# Patient Record
Sex: Female | Born: 1990 | Hispanic: No | Marital: Single | Smoking: Never smoker
Health system: Southern US, Community
[De-identification: ages and names within clinical notes are randomized; demographics above are authoritative.]

## PROBLEM LIST (undated history)

## (undated) ENCOUNTER — Inpatient Hospital Stay (HOSPITAL_COMMUNITY): Payer: Self-pay

## (undated) DIAGNOSIS — O139 Gestational [pregnancy-induced] hypertension without significant proteinuria, unspecified trimester: Secondary | ICD-10-CM

## (undated) DIAGNOSIS — E039 Hypothyroidism, unspecified: Secondary | ICD-10-CM

## (undated) DIAGNOSIS — O1490 Unspecified pre-eclampsia, unspecified trimester: Secondary | ICD-10-CM

## (undated) HISTORY — PX: OTHER SURGICAL HISTORY: SHX169

## (undated) HISTORY — DX: Unspecified pre-eclampsia, unspecified trimester: O14.90

## (undated) HISTORY — DX: Hypothyroidism, unspecified: E03.9

---
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
## 2012-03-04 ENCOUNTER — Encounter (HOSPITAL_COMMUNITY): Payer: Self-pay | Admitting: *Deleted

## 2012-03-04 ENCOUNTER — Inpatient Hospital Stay (HOSPITAL_COMMUNITY)
Admission: AD | Admit: 2012-03-04 | Discharge: 2012-03-05 | Disposition: A | Discharge status: Home / Self Care | Payer: Medicaid Other | Source: Ambulatory Visit | Attending: Obstetrics & Gynecology | Admitting: Obstetrics & Gynecology

## 2012-03-04 ENCOUNTER — Inpatient Hospital Stay (HOSPITAL_COMMUNITY): Payer: Medicaid Other

## 2012-03-04 DIAGNOSIS — O99891 Other specified diseases and conditions complicating pregnancy: Secondary | ICD-10-CM | POA: Insufficient documentation

## 2012-03-04 DIAGNOSIS — O0930 Supervision of pregnancy with insufficient antenatal care, unspecified trimester: Secondary | ICD-10-CM | POA: Insufficient documentation

## 2012-03-04 DIAGNOSIS — N949 Unspecified condition associated with female genital organs and menstrual cycle: Secondary | ICD-10-CM

## 2012-03-04 DIAGNOSIS — R109 Unspecified abdominal pain: Secondary | ICD-10-CM | POA: Insufficient documentation

## 2012-03-04 DIAGNOSIS — O26859 Spotting complicating pregnancy, unspecified trimester: Secondary | ICD-10-CM

## 2012-03-04 LAB — URINALYSIS, ROUTINE W REFLEX MICROSCOPIC
Glucose, UA: NEGATIVE mg/dL
Nitrite: NEGATIVE
Protein, ur: NEGATIVE mg/dL
Urobilinogen, UA: 0.2 mg/dL (ref 0.0–1.0)

## 2012-03-04 LAB — URINE MICROSCOPIC-ADD ON

## 2012-03-04 NOTE — MAU Note (Signed)
Patient is here with c/o bilateral intermittent sharp pain in her sides.(not quite flank area). She have no prenatal care;Marland Kitchen She states that she confirmed pregnancy at health dept. lmp 09/06/11. She denies any vaginal bleeding or discharge today but have intermittent light bleeding. She reports good fetal movement.

## 2012-03-04 NOTE — MAU Note (Signed)
Pt states, " I am having pain in my right side of my abdomen and my lower back. It hurts when I am laying down and get up or sit and getting up."

## 2012-03-04 NOTE — MAU Provider Note (Signed)
History     CSN: [id number]  Arrival date and time: 03/04/12 1955   None     Chief Complaint  Patient presents with  . Abdominal Pain   HPI  21 year old female G1P0 presents with abdominal pain.  Of note, she has light bleeding around the 9th of every month, the longest of which was in December.  About 3 weeks ago she noticed she was gaining weight, took a home pregnancy test and it was positive.   She has thus far not received any prenatal care.  She does not have a plan currently for who she would like to receive prenatal care from and does not have a primary care doctor.  Her mother reports having called "everywhere in [place]" but no one will see her as she does not have health insurance.  They report having started the [facility] application process.  Her abdominal pain started overnight and woke her from sleep.  She was able to fall back to sleep but it woke her up again several time.  It is localized to the bilateral lower quadrants.  No radiation.  Standing up makes it worse.  Nothing makes it better.  She has not tried any over the counter medications or alternative therapies at this point.  She came in because the pain was abnormal.  It has not worsened or improved over time.  It is constant.  Pain is sharp.    OB History    Grav Para Term Preterm Abortions TAB SAB Ect Mult Living   1               History reviewed. No pertinent past medical history.  Past Surgical History  Procedure Date  . Penny removed from throat     as a child. no scar noted.    History reviewed. No pertinent family history.  History  Substance Use Topics  . Smoking status: Not on file  . Smokeless tobacco: Not on file  . Alcohol Use: Not on file  Never smoker, never used any smokeless tobacco,  Never drinker.  No illicit drugs.  Allergies: No Known Allergies  No prescriptions prior to admission    Review of Systems  Constitutional: Negative for fever, chills and diaphoresis.    Eyes: Negative for blurred vision and double vision.  Respiratory: Negative for shortness of breath.   Cardiovascular: Negative for chest pain.  Gastrointestinal: Positive for abdominal pain (see HPI). Negative for heartburn, nausea, vomiting, diarrhea and constipation.  Genitourinary: Negative for dysuria, frequency and hematuria.  Skin: Negative for itching and rash.  Neurological: Positive for headaches (Has intermittent headaches, none current.). Negative for seizures and loss of consciousness.  Psychiatric/Behavioral: Negative for depression. The patient is not nervous/anxious.    Physical Exam   Blood pressure 131/72, pulse 85, temperature 99 F (37.2 C), temperature source Oral, resp. rate 16, height 5' 4.5" (1.638 m), weight 77.111 kg (170 lb), last menstrual period 09/06/2011.  Physical Exam  Constitutional: She is oriented to person, place, and time. She appears well-developed and well-nourished. No distress.  HENT:  Head: Normocephalic and atraumatic.  Mouth/Throat: No oropharyngeal exudate.  Eyes: Conjunctivae are normal. Right eye exhibits no discharge. Left eye exhibits no discharge. No scleral icterus.  Neck: No tracheal deviation present.  Cardiovascular: Normal rate, regular rhythm and normal heart sounds.   Respiratory: Effort normal and breath sounds normal. No stridor.  GI: Soft. She exhibits no distension. There is no tenderness. There is no rebound and no  guarding.       Fundus palpated 25 cm above pubic symphysis  Neurological: She is alert and oriented to person, place, and time.  Skin: Skin is warm and dry. She is not diaphoretic.  Psychiatric: She has a normal mood and affect. Her behavior is normal. Judgment and thought content normal.    MAU Course  Procedures  Fetal: Baseline rate 145, 15x15 accels, mild variable decels Toco: No contractions Ultrasound: No placental abnormalities, [redacted]w[redacted]d gestation Type and screen: O+, antibody positive but levels and  type pending at time of discharge.  Assessment and Plan  22 year old G1P0 about 25 weeks by palpation, realized she was pregnant ~3 weeks ago.  She has had intermittent vaginal bleeding but has none currently.  No placental abnormalities on ultrasound.  Patient was offered cervical exam and speculum exam, but declined as she wanted to leave.  Pain probably secondary to round ligament pain.  No signs or symptoms concerning for intraabdominal catastrophy.  Will rx flexeril for symptomatic relief.  Patient counseled extensively about accessing the prenatal care system, including our low risk clinic.  She was also told about her ultrasound result, her new EDC, and her antibodies.   Clancy Gourd 03/04/2012, 10:59 PM

## 2012-03-05 ENCOUNTER — Encounter (HOSPITAL_COMMUNITY): Payer: Self-pay | Admitting: Family Medicine

## 2012-03-05 DIAGNOSIS — N949 Unspecified condition associated with female genital organs and menstrual cycle: Secondary | ICD-10-CM

## 2012-03-05 LAB — TYPE AND SCREEN: DAT, IgG: NEGATIVE

## 2012-03-05 MED ORDER — CYCLOBENZAPRINE HCL 5 MG PO TABS: 5.0000 mg | ORAL_TABLET | Freq: Three times a day (TID) | ORAL | Status: AC | PRN

## 2012-03-05 NOTE — MAU Provider Note (Signed)
I was present for the exam and agree with above.  Lateasha Breuer, CNM 03/05/2012 2:26 AM  

## 2012-03-07 LAB — URINE CULTURE
Culture  Setup Time: 201306081325
Special Requests: NORMAL

## 2012-03-15 ENCOUNTER — Other Ambulatory Visit: Payer: Self-pay | Admitting: Advanced Practice Midwife

## 2012-03-15 DIAGNOSIS — O2340 Unspecified infection of urinary tract in pregnancy, unspecified trimester: Secondary | ICD-10-CM

## 2012-03-15 NOTE — Progress Notes (Signed)
Rx Macrobid for UTI. 

## 2012-03-16 MED ORDER — NITROFURANTOIN MONOHYD MACRO 100 MG PO CAPS: 100.0000 mg | ORAL_CAPSULE | Freq: Two times a day (BID) | ORAL | Status: AC

## 2012-04-13 ENCOUNTER — Encounter (HOSPITAL_COMMUNITY): Payer: Self-pay | Admitting: *Deleted

## 2012-04-13 ENCOUNTER — Inpatient Hospital Stay (HOSPITAL_COMMUNITY)
Admission: AD | Admit: 2012-04-13 | Discharge: 2012-04-13 | Disposition: A | Discharge status: Home / Self Care | Payer: Medicaid Other | Source: Ambulatory Visit | Attending: Obstetrics & Gynecology | Admitting: Obstetrics & Gynecology

## 2012-04-13 DIAGNOSIS — B373 Candidiasis of vulva and vagina: Secondary | ICD-10-CM

## 2012-04-13 DIAGNOSIS — O2340 Unspecified infection of urinary tract in pregnancy, unspecified trimester: Secondary | ICD-10-CM

## 2012-04-13 DIAGNOSIS — R109 Unspecified abdominal pain: Secondary | ICD-10-CM | POA: Insufficient documentation

## 2012-04-13 DIAGNOSIS — O99891 Other specified diseases and conditions complicating pregnancy: Secondary | ICD-10-CM | POA: Insufficient documentation

## 2012-04-13 LAB — WET PREP, GENITAL: Trich, Wet Prep: NONE SEEN

## 2012-04-13 LAB — URINALYSIS, ROUTINE W REFLEX MICROSCOPIC
Bilirubin Urine: NEGATIVE
Glucose, UA: NEGATIVE mg/dL
Ketones, ur: NEGATIVE mg/dL
Specific Gravity, Urine: 1.025 (ref 1.005–1.030)
pH: 6 (ref 5.0–8.0)

## 2012-04-13 LAB — URINE MICROSCOPIC-ADD ON

## 2012-04-13 MED ORDER — MICONAZOLE NITRATE 2 % EX CREA: TOPICAL_CREAM | CUTANEOUS | Status: DC

## 2012-04-13 MED ORDER — PRENATAL PLUS 27-1 MG PO TABS: 1.0000 | ORAL_TABLET | Freq: Every day | ORAL | Status: DC

## 2012-04-13 MED ORDER — NITROFURANTOIN MONOHYD MACRO 100 MG PO CAPS: 100.0000 mg | ORAL_CAPSULE | Freq: Two times a day (BID) | ORAL | Status: AC

## 2012-04-13 NOTE — MAU Note (Signed)
Patient states she started having sharp lower abdominal pain today. Has not had prenatal care. Denies any bleeding or leaking and reports good fetal movement.

## 2012-04-13 NOTE — MAU Provider Note (Signed)
  History   CSN: [id number]  Arrival date and time: 04/13/12 1826  Chief Complaint  Patient presents with  . Abdominal Pain   HPI Patient is an 21 yo G1P0 at 33.2 EGA who presents with complaint of abdominal cramping.  She states this started at noon today and stopped around 3 pm. States the cramping is over her lower abdomen only.  It would last about 5 minutes at a time and there would be 10 minute gaps between cramping episodes.  She denies any discharge, bleeding, burning with urination, or other pain.  States she is urinating normally and having normal bowel movements.  She states she has not had any contractions or loss of fluid.  She says that her baby is moving normally.  OB History    Grav Para Term Preterm Abortions TAB SAB Ect Mult Living   1              Has not had any prenatal care to this point.  History reviewed. No pertinent past medical history.  Past Surgical History  Procedure Date  . Penny removed from throat     as a child. no scar noted.    History reviewed. No pertinent family history.  History  Substance Use Topics  . Smoking status: Never Smoker   . Smokeless tobacco: Not on file  . Alcohol Use: No    Allergies: No Known Allergies  No prescriptions prior to admission    ROS negative except per HPI Physical Exam   Blood pressure 138/83, pulse 97, temperature 98.9 F (37.2 C), temperature source Oral, resp. rate 16, height 5\' 5"  (1.651 m), weight 81.829 kg (180 lb 6.4 oz), last menstrual period 09/06/2011, SpO2 100.00%.  Physical Exam  Constitutional: She is oriented to person, place, and time. She appears well-developed and well-nourished.  HENT:  Head: Normocephalic and atraumatic.  Cardiovascular: Normal rate, regular rhythm and normal heart sounds.   Respiratory: Effort normal and breath sounds normal.  GI: Soft. There is tenderness (suprapubic tenderness). There is no rebound and no guarding.       Gravid abdomen  Genitourinary:   Speculum exam: moderate amount of chunky white discharge Cervix non-tender, closed  Musculoskeletal: She exhibits no edema.  Neurological: She is alert and oriented to person, place, and time.  Psychiatric: She has a normal mood and affect.   FHT: 150, moderate variability, accels present, decels absent No contractions present. MAU Course  Procedures  Assessment and Plan  Patient is a G1P0 at 34.2 EGA who presents for abdominal cramping.  UA pending GC/Chlamydia pending Wet prep pending  Patient discussed with Philipp Deputy, CNM and signed out to her.  Marikay Alar 04/13/2012, 7:48 PM   ADDENDUM: Wet prep shows yeast but no other abnormalities.  UA shows +LE and microscopy reveals abnormal # of WBCs and some bacteria.  Will treat with topical antifungals and Macrobid for presumed UTI.  Urine culture sent.  GC/Chlamydia sent.  Pt in agreement with plan and was feeling well at time of discharge.  Hope to get her into the Low-Risk clinic at [facility] soon for prenatal care, have sent message.  Mora Bellman, MD - PGY3

## 2012-04-14 ENCOUNTER — Encounter: Payer: Self-pay | Admitting: *Deleted

## 2012-04-14 LAB — GC/CHLAMYDIA PROBE AMP, GENITAL: Chlamydia, DNA Probe: NEGATIVE

## 2012-04-16 LAB — URINE CULTURE: Colony Count: >=100000

## 2012-04-27 ENCOUNTER — Ambulatory Visit (INDEPENDENT_AMBULATORY_CARE_PROVIDER_SITE_OTHER): Payer: Commercial Managed Care - PPO | Admitting: Family Medicine

## 2012-04-27 ENCOUNTER — Encounter: Payer: Self-pay | Admitting: Family Medicine

## 2012-04-27 VITALS — BP 122/80 | Temp 97.5°F | Wt 183.8 lb

## 2012-04-27 DIAGNOSIS — O0930 Supervision of pregnancy with insufficient antenatal care, unspecified trimester: Secondary | ICD-10-CM

## 2012-04-27 LAB — POCT URINALYSIS DIP (DEVICE)
Bilirubin Urine: NEGATIVE
Glucose, UA: NEGATIVE mg/dL
Hgb urine dipstick: NEGATIVE
Leukocytes, UA: NEGATIVE
Nitrite: NEGATIVE
Urobilinogen, UA: 0.2 mg/dL (ref 0.0–1.0)
pH: 7 (ref 5.0–8.0)

## 2012-04-27 NOTE — Progress Notes (Signed)
Pulse: 94 1hr gtt  Due at 1030

## 2012-04-27 NOTE — Patient Instructions (Addendum)
Braxton Hicks Contractions Pregnancy is commonly associated with contractions of the uterus throughout the pregnancy. Towards the end of pregnancy (32 to 34 weeks), these contractions [facility] Willa Rough) can develop more often and may become more forceful. This is not true labor because these contractions do not result in opening (dilatation) and thinning of the cervix. They are sometimes difficult to tell apart from true labor because these contractions can be forceful and people have different pain tolerances. You should not feel embarrassed if you go to the hospital with false labor. Sometimes, the only way to tell if you are in true labor is for your caregiver to follow the changes in the cervix. How to tell the difference between true and false labor:  False labor.   The contractions of false labor are usually shorter, irregular and not as hard as those of true labor.   They are often felt in the front of the lower abdomen and in the groin.   They may leave with walking around or changing positions while lying down.   They get weaker and are shorter lasting as time goes on.   These contractions are usually irregular.   They do not usually become progressively stronger, regular and closer together as with true labor.   True labor.   Contractions in true labor last 30 to 70 seconds, become very regular, usually become more intense, and increase in frequency.   They do not go away with walking.   The discomfort is usually felt in the top of the uterus and spreads to the lower abdomen and low back.   True labor can be determined by your caregiver with an exam. This will show that the cervix is dilating and getting thinner.  If there are no prenatal problems or other health problems associated with the pregnancy, it is completely safe to be sent home with false labor and await the onset of true labor. HOME CARE INSTRUCTIONS   Keep up with your usual exercises and instructions.   Take  medications as directed.   Keep your regular prenatal appointment.   Eat and drink lightly if you think you are going into labor.   If BH contractions are making you uncomfortable:   Change your activity position from lying down or resting to walking/walking to resting.   Sit and rest in a tub of warm water.   Drink 2 to 3 glasses of water. Dehydration may cause B-H contractions.   Do slow and deep breathing several times an hour.  SEEK IMMEDIATE MEDICAL CARE IF:   Your contractions continue to become stronger, more regular, and closer together.   You have a gushing, burst or leaking of fluid from the vagina.   An oral temperature above 102 F (38.9 C) develops.   You have passage of blood-tinged mucus.   You develop vaginal bleeding.   You develop continuous belly (abdominal) pain.   You have low back pain that you never had before.   You feel the baby's head pushing down causing pelvic pressure.   The baby is not moving as much as it used to.  Document Released: 09/14/2005 Document Revised: 09/03/2011 Document Reviewed: 03/08/2009 [facility] Patient Information 2012 [place], [place]. Pregnancy - Third Trimester The third trimester of pregnancy (the last 3 months) is a period of the most rapid growth for you and your baby. The baby approaches a length of 20 inches and a weight of 6 to 10 pounds. The baby is adding on fat and getting  ready for life outside your body. While inside, babies have periods of sleeping and waking, suck their thumbs, and hiccups. You can often feel small contractions of the uterus. This is false labor. It is also called Braxton-Hicks contractions. This is like a practice for labor. The usual problems in this stage of pregnancy include more difficulty breathing, swelling of the hands and feet from water retention, and having to urinate more often because of the uterus and baby pressing on your bladder.  PRENATAL EXAMS  Blood work may continue to be done  during prenatal exams. These tests are done to check on your health and the probable health of your baby. Blood work is used to follow your blood levels (hemoglobin). Anemia (low hemoglobin) is common during pregnancy. Iron and vitamins are given to help prevent this. You may also continue to be checked for diabetes. Some of the past blood tests may be done again.   The size of the uterus is measured during each visit. This makes sure your baby is growing properly according to your pregnancy dates.   Your blood pressure is checked every prenatal visit. This is to make sure you are not getting toxemia.   Your urine is checked every prenatal visit for infection, diabetes and protein.   Your weight is checked at each visit. This is done to make sure gains are happening at the suggested rate and that you and your baby are growing normally.   Sometimes, an ultrasound is performed to confirm the position and the proper growth and development of the baby. This is a test done that bounces harmless sound waves off the baby so your caregiver can more accurately determine due dates.   Discuss the type of pain medication and anesthesia you will have during your labor and delivery.   Discuss the possibility and anesthesia if a Cesarean Section might be necessary.   Inform your caregiver if there is any mental or physical violence at home.  Sometimes, a specialized non-stress test, contraction stress test and biophysical profile are done to make sure the baby is not having a problem. Checking the amniotic fluid surrounding the baby is called an amniocentesis. The amniotic fluid is removed by sticking a needle into the belly (abdomen). This is sometimes done near the end of pregnancy if an early delivery is required. In this case, it is done to help make sure the baby's lungs are mature enough for the baby to live outside of the womb. If the lungs are not mature and it is unsafe to deliver the baby, an injection  of cortisone medication is given to the mother 1 to 2 days before the delivery. This helps the baby's lungs mature and makes it safer to deliver the baby. CHANGES OCCURING IN THE THIRD TRIMESTER OF PREGNANCY Your body goes through many changes during pregnancy. They vary from person to person. Talk to your caregiver about changes you notice and are concerned about.  During the last trimester, you have probably had an increase in your appetite. It is normal to have cravings for certain foods. This varies from person to person and pregnancy to pregnancy.   You may begin to get stretch marks on your hips, abdomen, and breasts. These are normal changes in the body during pregnancy. There are no exercises or medications to take which prevent this change.   Constipation may be treated with a stool softener or adding bulk to your diet. Drinking lots of fluids, fiber in vegetables, fruits, and  whole grains are helpful.   Exercising is also helpful. If you have been very active up until your pregnancy, most of these activities can be continued during your pregnancy. If you have been less active, it is helpful to start an exercise program such as walking. Consult your caregiver before starting exercise programs.   Avoid all smoking, alcohol, un-prescribed drugs, herbs and "street drugs" during your pregnancy. These chemicals affect the formation and growth of the baby. Avoid chemicals throughout the pregnancy to ensure the delivery of a healthy infant.   Backache, varicose veins and hemorrhoids may develop or get worse.   You will tire more easily in the third trimester, which is normal.   The baby's movements may be stronger and more often.   You may become short of breath easily.   Your belly button may stick out.   A yellow discharge may leak from your breasts called colostrum.   You may have a bloody mucus discharge. This usually occurs a few days to a week before labor begins.  HOME CARE  INSTRUCTIONS   Keep your caregiver's appointments. Follow your caregiver's instructions regarding medication use, exercise, and diet.   During pregnancy, you are providing food for you and your baby. Continue to eat regular, well-balanced meals. Choose foods such as meat, fish, milk and other low fat dairy products, vegetables, fruits, and whole-grain breads and cereals. Your caregiver will tell you of the ideal weight gain.   A physical sexual relationship may be continued throughout pregnancy if there are no other problems such as early (premature) leaking of amniotic fluid from the membranes, vaginal bleeding, or belly (abdominal) pain.   Exercise regularly if there are no restrictions. Check with your caregiver if you are unsure of the safety of your exercises. Greater weight gain will occur in the last 2 trimesters of pregnancy. Exercising helps:   Control your weight.   Get you in shape for labor and delivery.   You lose weight after you deliver.   Rest a lot with legs elevated, or as needed for leg cramps or low back pain.   Wear a good support or jogging bra for breast tenderness during pregnancy. This may help if worn during sleep. Pads or tissues may be used in the bra if you are leaking colostrum.   Do not use hot tubs, steam rooms, or saunas.   Wear your seat belt when driving. This protects you and your baby if you are in an accident.   Avoid raw meat, cat litter boxes and soil used by cats. These carry germs that can cause birth defects in the baby.   It is easier to loose urine during pregnancy. Tightening up and strengthening the pelvic muscles will help with this problem. You can practice stopping your urination while you are going to the bathroom. These are the same muscles you need to strengthen. It is also the muscles you would use if you were trying to stop from passing gas. You can practice tightening these muscles up 10 times a set and repeating this about 3 times per  day. Once you know what muscles to tighten up, do not perform these exercises during urination. It is more likely to cause an infection by backing up the urine.   Ask for help if you have financial, counseling or nutritional needs during pregnancy. Your caregiver will be able to offer counseling for these needs as well as refer you for other special needs.   Make a list  of emergency phone numbers and have them available.   Plan on getting help from family or friends when you go home from the hospital.   Make a trial run to the hospital.   Take prenatal classes with the father to understand, practice and ask questions about the labor and delivery.   Prepare the baby's room/nursery.   Do not travel out of the city unless it is absolutely necessary and with the advice of your caregiver.   Wear only low or no heal shoes to have better balance and prevent falling.  MEDICATIONS AND DRUG USE IN PREGNANCY  Take prenatal vitamins as directed. The vitamin should contain 1 milligram of folic acid. Keep all vitamins out of reach of children. Only a couple vitamins or tablets containing iron may be fatal to a baby or young child when ingested.   Avoid use of all medications, including herbs, over-the-counter medications, not prescribed or suggested by your caregiver. Only take over-the-counter or prescription medicines for pain, discomfort, or fever as directed by your caregiver. Do not use aspirin, ibuprofen (Motrin, Advil, Nuprin) or naproxen (Aleve) unless OK'd by your caregiver.   Let your caregiver also know about herbs you may be using.   Alcohol is related to a number of birth defects. This includes fetal alcohol syndrome. All alcohol, in any form, should be avoided completely. Smoking will cause low birth rate and premature babies.   Street/illegal drugs are very harmful to the baby. They are absolutely forbidden. A baby born to an addicted mother will be addicted at birth. The baby will go  through the same withdrawal an adult does.  SEEK MEDICAL CARE IF: You have any concerns or worries during your pregnancy. It is better to call with your questions if you feel they cannot wait, rather than worry about them. DECISIONS ABOUT CIRCUMCISION You may or may not know the sex of your baby. If you know your baby is a boy, it may be time to think about circumcision. Circumcision is the removal of the foreskin of the penis. This is the skin that covers the sensitive end of the penis. There is no proven medical need for this. Often this decision is made on what is popular at the time or based upon religious beliefs and social issues. You can discuss these issues with your caregiver or pediatrician. SEEK IMMEDIATE MEDICAL CARE IF:   An unexplained oral temperature above 102 F (38.9 C) develops, or as your caregiver suggests.   You have leaking of fluid from the vagina (birth canal). If leaking membranes are suspected, take your temperature and tell your caregiver of this when you call.   There is vaginal spotting, bleeding or passing clots. Tell your caregiver of the amount and how many pads are used.   You develop a bad smelling vaginal discharge with a change in the color from clear to white.   You develop vomiting that lasts more than 24 hours.   You develop chills or fever.   You develop shortness of breath.   You develop burning on urination.   You loose more than 2 pounds of weight or gain more than 2 pounds of weight or as suggested by your caregiver.   You notice sudden swelling of your face, hands, and feet or legs.   You develop belly (abdominal) pain. Round ligament discomfort is a common non-cancerous (benign) cause of abdominal pain in pregnancy. Your caregiver still must evaluate you.   You develop a severe headache that  does not go away.   You develop visual problems, blurred or double vision.   If you have not felt your baby move for more than 1 hour. If you think  the baby is not moving as much as usual, eat something with sugar in it and lie down on your left side for an hour. The baby should move at least 4 to 5 times per hour. Call right away if your baby moves less than that.   You fall, are in a car accident or any kind of trauma.   There is mental or physical violence at home.  Document Released: 09/08/2001 Document Revised: 09/03/2011 Document Reviewed: 03/13/2009 [facility] Patient Information 2012 [place], [place].

## 2012-04-27 NOTE — Progress Notes (Signed)
   Subjective:    [name] [name] is a G1P0 [redacted]w[redacted]d being seen today for her first obstetrical visit.  Her obstetrical history is significant for late to care. Patient does intend to breast feed. Pregnancy history fully reviewed.  Patient reports no bleeding, no contractions, no cramping and no leaking.  Filed Vitals:   04/27/12 0919  BP: 122/80  Temp: 97.5 F (36.4 C)  Weight: 183 lb 12.8 oz (83.371 kg)    HISTORY: OB History    Grav Para Term Preterm Abortions TAB SAB Ect Mult Living   1              # Outc Date GA Lbr Len/2nd Wgt Sex Del Anes PTL Lv   1 CUR              History reviewed. No pertinent past medical history. Past Surgical History  Procedure Date  . Penny removed from throat     as a child. no scar noted.   Family History  Problem Relation Age of Onset  . Hypertension Mother      Exam    Uterus:     Pelvic Exam:    Perineum: No Hemorrhoids, Normal Perineum   Vulva: normal   Vagina:  normal mucosa  System:     Skin: normal coloration and turgor, no rashes    Neurologic: oriented, normal, gait normal; reflexes normal and symmetric   Extremities: normal strength, tone, and muscle mass   HEENT PERRLA and extra ocular movement intact       Neck supple and no masses   Cardiovascular: regular rate and rhythm, no murmurs or gallops   Respiratory:  appears well, vitals normal, no respiratory distress, acyanotic, normal RR, ear and throat exam is normal, neck free of mass or lymphadenopathy, chest clear, no wheezing, crepitations, rhonchi, normal symmetric air entry   Abdomen: soft, non-tender; bowel sounds normal; no masses,  no organomegaly   Urinary: urethral meatus normal      Assessment:    Pregnancy: G1P0 There is no problem list on file for this patient.       Plan:     Initial labs drawn. Prenatal vitamins. Problem list reviewed and updated. Genetic Screening discussed: late to care.  Ultrasound discussed; fetal survey: results  reviewed.  Follow up in 1 weeks. 50% of 45 min visit spent on counseling and coordination of care.    [name] [name] JEHIEL 04/27/2012

## 2012-04-28 ENCOUNTER — Encounter: Payer: Self-pay | Admitting: Family Medicine

## 2012-04-28 DIAGNOSIS — R768 Other specified abnormal immunological findings in serum: Secondary | ICD-10-CM | POA: Insufficient documentation

## 2012-04-28 LAB — OBSTETRIC PANEL
Antibody Screen: NEGATIVE
Basophils Relative: 0 % (ref 0–1)
Eosinophils Absolute: 0 10*3/uL (ref 0.0–0.7)
Eosinophils Relative: 1 % (ref 0–5)
Hemoglobin: 11.6 g/dL — ABNORMAL LOW (ref 12.0–15.0)
Lymphs Abs: 1.5 10*3/uL (ref 0.7–4.0)
MCH: 28.6 pg (ref 26.0–34.0)
MCHC: 33 g/dL (ref 30.0–36.0)
MCV: 86.7 fL (ref 78.0–100.0)
Monocytes Absolute: 0.3 10*3/uL (ref 0.1–1.0)
Monocytes Relative: 4 % (ref 3–12)
Neutrophils Relative %: 77 % (ref 43–77)
RBC: 4.05 MIL/uL (ref 3.87–5.11)
Rh Type: POSITIVE

## 2012-04-28 LAB — T.PALLIDUM AB, TOTAL: T pallidum Antibodies (TP-PA): 0.09 S/CO (ref <0.90)

## 2012-04-28 LAB — GLUCOSE TOLERANCE, 1 HOUR (50G) W/O FASTING: Glucose, 1 Hour GTT: 98 mg/dL (ref 70–140)

## 2012-04-29 LAB — HEMOGLOBINOPATHY EVALUATION
Hemoglobin Other: 0 %
Hgb A: 96.1 % — ABNORMAL LOW (ref 96.8–97.8)
Hgb S Quant: 0 %

## 2012-04-30 LAB — CULTURE, OB URINE: Colony Count: >=100000

## 2012-05-01 LAB — CULTURE, BETA STREP (GROUP B ONLY)

## 2012-05-02 ENCOUNTER — Other Ambulatory Visit: Payer: Self-pay | Admitting: Family Medicine

## 2012-05-02 MED ORDER — AMOXICILLIN 500 MG PO CAPS: 500.0000 mg | ORAL_CAPSULE | Freq: Three times a day (TID) | ORAL | Status: AC

## 2012-05-03 ENCOUNTER — Telehealth: Payer: Self-pay | Admitting: *Deleted

## 2012-05-03 DIAGNOSIS — B951 Streptococcus, group B, as the cause of diseases classified elsewhere: Secondary | ICD-10-CM

## 2012-05-03 NOTE — Telephone Encounter (Signed)
Called pt and informed of +UTI requiring Tx. Pt advised of dosage instructions and Rx has been sent to her pharmacy. Pt voiced understanding.

## 2012-05-03 NOTE — Telephone Encounter (Signed)
Message copied by Jill Side on Tue May 03, 2012  9:39 AM ------      Message from: Levie Heritage      Created: Mon May 02, 2012  6:07 PM       Please call patient, had + urine culture.  Will treat with amoxicillin 500mg  TID x 7 days.  Prescription sent to pharmacy.

## 2012-05-04 ENCOUNTER — Ambulatory Visit (INDEPENDENT_AMBULATORY_CARE_PROVIDER_SITE_OTHER): Payer: Commercial Managed Care - PPO | Admitting: Advanced Practice Midwife

## 2012-05-04 VITALS — BP 124/87 | Temp 97.4°F | Wt 188.1 lb

## 2012-05-04 DIAGNOSIS — O0930 Supervision of pregnancy with insufficient antenatal care, unspecified trimester: Secondary | ICD-10-CM

## 2012-05-04 DIAGNOSIS — O2390 Unspecified genitourinary tract infection in pregnancy, unspecified trimester: Secondary | ICD-10-CM

## 2012-05-04 DIAGNOSIS — N390 Urinary tract infection, site not specified: Secondary | ICD-10-CM

## 2012-05-04 DIAGNOSIS — O2340 Unspecified infection of urinary tract in pregnancy, unspecified trimester: Secondary | ICD-10-CM

## 2012-05-04 DIAGNOSIS — B951 Streptococcus, group B, as the cause of diseases classified elsewhere: Secondary | ICD-10-CM

## 2012-05-04 LAB — POCT URINALYSIS DIP (DEVICE)
Glucose, UA: NEGATIVE mg/dL
Nitrite: NEGATIVE
Protein, ur: NEGATIVE mg/dL
Specific Gravity, Urine: 1.02 (ref 1.005–1.030)
Urobilinogen, UA: 0.2 mg/dL (ref 0.0–1.0)

## 2012-05-04 NOTE — Patient Instructions (Signed)
Normal Labor and Delivery Your caregiver must first be sure you are in labor. Signs of labor include:  You may pass what is called "the mucus plug" before labor begins. This is a small amount of blood stained mucus.   Regular uterine contractions.   The time between contractions get closer together.   The discomfort and pain gradually gets more intense.   Pains are mostly located in the back.   Pains get worse when walking.   The cervix (the opening of the uterus becomes thinner (begins to efface) and opens up (dilates).  Once you are in labor and admitted into the hospital or care center, your caregiver will do the following:  A complete physical examination.   Check your vital signs (blood pressure, pulse, temperature and the fetal heart rate).   Do a vaginal examination (using a sterile glove and lubricant) to determine:   The position (presentation) of the baby (head [vertex] or buttock first).   The level (station) of the baby's head in the birth canal.   The effacement and dilatation of the cervix.   You may have your pubic hair shaved and be given an enema depending on your caregiver and the circumstance.   An electronic monitor is usually placed on your abdomen. The monitor follows the length and intensity of the contractions, as well as the baby's heart rate.   Usually, your caregiver will insert an IV in your arm with a bottle of sugar water. This is done as a precaution so that medications can be given to you quickly during labor or delivery.  NORMAL LABOR AND DELIVERY IS DIVIDED UP INTO 3 STAGES: First Stage This is when regular contractions begin and the cervix begins to efface and dilate. This stage can last from 3 to 15 hours. The end of the first stage is when the cervix is 100% effaced and 10 centimeters dilated. Pain medications may be given by   Injection (morphine, demerol, etc.)   Regional anesthesia (spinal, caudal or epidural, anesthetics given in  different locations of the spine). Paracervical pain medication may be given, which is an injection of and anesthetic on each side of the cervix.  A pregnant woman may request to have "Natural Childbirth" which is not to have any medications or anesthesia during her labor and delivery. Second Stage This is when the baby comes down through the birth canal (vagina) and is born. This can take 1 to 4 hours. As the baby's head comes down through the birth canal, you may feel like you are going to have a bowel movement. You will get the urge to bear down and push until the baby is delivered. As the baby's head is being delivered, the caregiver will decide if an episiotomy (a cut in the perineum and vagina area) is needed to prevent tearing of the tissue in this area. The episiotomy is sewn up after the delivery of the baby and placenta. Sometimes a mask with nitrous oxide is given for the mother to breath during the delivery of the baby to help if there is too much pain. The end of Stage 2 is when the baby is fully delivered. Then when the umbilical cord stops pulsating it is clamped and cut. Third Stage The third stage begins after the baby is completely delivered and ends after the placenta (afterbirth) is delivered. This usually takes 5 to 30 minutes. After the placenta is delivered, a medication is given either by intravenous or injection to help contract   the uterus and prevent bleeding. The third stage is not painful and pain medication is usually not necessary. If an episiotomy was done, it is repaired at this time. After the delivery, the mother is watched and monitored closely for 1 to 2 hours to make sure there is no postpartum bleeding (hemorrhage). If there is a lot of bleeding, medication is given to contract the uterus and stop the bleeding. Document Released: 06/23/2008 Document Revised: 09/03/2011 Document Reviewed: 06/23/2008 Exit[employer] Patient Information 2012 [employer]. 

## 2012-05-04 NOTE — Progress Notes (Signed)
Doing well. More contractions this week. Reviewed labor precautions and where to go. Discussed +GBS and treatment

## 2012-05-04 NOTE — Progress Notes (Signed)
Pulse 99 Has pelvic pressure, some swelling in her hands and feet.

## 2012-05-11 ENCOUNTER — Ambulatory Visit (INDEPENDENT_AMBULATORY_CARE_PROVIDER_SITE_OTHER): Payer: Commercial Managed Care - PPO | Admitting: Family

## 2012-05-11 VITALS — BP 129/86 | Temp 97.1°F | Wt 189.9 lb

## 2012-05-11 DIAGNOSIS — O0930 Supervision of pregnancy with insufficient antenatal care, unspecified trimester: Secondary | ICD-10-CM

## 2012-05-11 DIAGNOSIS — O2340 Unspecified infection of urinary tract in pregnancy, unspecified trimester: Secondary | ICD-10-CM

## 2012-05-11 DIAGNOSIS — B951 Streptococcus, group B, as the cause of diseases classified elsewhere: Secondary | ICD-10-CM

## 2012-05-11 DIAGNOSIS — N390 Urinary tract infection, site not specified: Secondary | ICD-10-CM

## 2012-05-11 DIAGNOSIS — O2390 Unspecified genitourinary tract infection in pregnancy, unspecified trimester: Secondary | ICD-10-CM

## 2012-05-11 LAB — CBC
Hemoglobin: 11.6 g/dL — ABNORMAL LOW (ref 12.0–15.0)
MCH: 29.4 pg (ref 26.0–34.0)
MCV: 84.3 fL (ref 78.0–100.0)
Platelets: 150 10*3/uL (ref 150–400)
RBC: 3.95 MIL/uL (ref 3.87–5.11)
RDW: 14.9 % (ref 11.5–15.5)

## 2012-05-11 LAB — COMPREHENSIVE METABOLIC PANEL
AST: 13 U/L (ref 0–37)
Alkaline Phosphatase: 96 U/L (ref 39–117)
BUN: 8 mg/dL (ref 6–23)
Glucose, Bld: 98 mg/dL (ref 70–99)
Sodium: 134 mEq/L — ABNORMAL LOW (ref 135–145)
Total Bilirubin: 0.6 mg/dL (ref 0.3–1.2)

## 2012-05-11 LAB — POCT URINALYSIS DIP (DEVICE)
Glucose, UA: NEGATIVE mg/dL
Hgb urine dipstick: NEGATIVE
Nitrite: NEGATIVE
Urobilinogen, UA: 1 mg/dL (ref 0.0–1.0)
pH: 6 (ref 5.0–8.0)

## 2012-05-11 NOTE — Progress Notes (Signed)
No PIH symptoms; blood pressure baseline; obtain PIH labs and given precautions.

## 2012-05-11 NOTE — Progress Notes (Signed)
P=98 , C/o runny nose of a clear mucous, sneezing. C/o edema feet only.c/o pelvic pressure,.

## 2012-05-13 ENCOUNTER — Encounter: Payer: Self-pay | Admitting: Obstetrics & Gynecology

## 2012-05-14 LAB — CREATININE CLEARANCE, URINE, 24 HOUR
Creatinine, Urine: 154.4 mg/dL
Creatinine: 0.51 mg/dL (ref 0.50–1.10)

## 2012-05-14 LAB — PROTEIN, URINE, 24 HOUR
Protein, 24H Urine: 95 mg/d (ref 50–100)
Protein, Urine: 10 mg/dL

## 2012-05-18 ENCOUNTER — Encounter: Payer: Self-pay | Admitting: Obstetrics and Gynecology

## 2012-05-18 ENCOUNTER — Ambulatory Visit (INDEPENDENT_AMBULATORY_CARE_PROVIDER_SITE_OTHER): Payer: Medicaid Other | Admitting: Obstetrics and Gynecology

## 2012-05-18 ENCOUNTER — Encounter: Payer: Self-pay | Admitting: Obstetrics & Gynecology

## 2012-05-18 VITALS — BP 134/88 | Temp 98.2°F | Wt 190.5 lb

## 2012-05-18 DIAGNOSIS — IMO0001 Reserved for inherently not codable concepts without codable children: Secondary | ICD-10-CM

## 2012-05-18 DIAGNOSIS — O0930 Supervision of pregnancy with insufficient antenatal care, unspecified trimester: Secondary | ICD-10-CM

## 2012-05-18 DIAGNOSIS — O139 Gestational [pregnancy-induced] hypertension without significant proteinuria, unspecified trimester: Secondary | ICD-10-CM

## 2012-05-18 LAB — POCT URINALYSIS DIP (DEVICE)
Bilirubin Urine: NEGATIVE
Hgb urine dipstick: NEGATIVE
Nitrite: NEGATIVE
Specific Gravity, Urine: 1.025 (ref 1.005–1.030)
Urobilinogen, UA: 0.2 mg/dL (ref 0.0–1.0)
pH: 6.5 (ref 5.0–8.0)

## 2012-05-18 NOTE — Addendum Note (Signed)
Addended by: Jill Side on: 05/18/2012 10:01 AM   Modules accepted: Orders

## 2012-05-18 NOTE — Progress Notes (Signed)
Has false + RPR. No H/A. Good FM. Feels like ? low fluid. EFW 6 1/2. BP recheck:  132/88.  On 8/17 24 hr pro 95, creat .51, LFTs, plts normal. Check AFI, BPP due to borderline BPs, risk preE. AFI 8.6 BPP 8/8. Return Fri for BP check only.

## 2012-05-18 NOTE — Progress Notes (Signed)
P= 87 LBP

## 2012-05-20 ENCOUNTER — Ambulatory Visit (INDEPENDENT_AMBULATORY_CARE_PROVIDER_SITE_OTHER): Payer: Medicaid Other | Admitting: Medical

## 2012-05-20 ENCOUNTER — Encounter: Payer: Self-pay | Admitting: Obstetrics & Gynecology

## 2012-05-20 VITALS — BP 125/91 | HR 85 | Resp 12

## 2012-05-20 DIAGNOSIS — O0930 Supervision of pregnancy with insufficient antenatal care, unspecified trimester: Secondary | ICD-10-CM

## 2012-05-20 NOTE — Progress Notes (Signed)
Patient ID: [name] [name], female   DOB: [birth year], 21 y.o.   MRN: [id number]  Discussed pt BP today with Diane Day, RN. Diane discussed precautions with the patient. Instructed her that she can take tylenol for HA. Patient voiced understanding and did not have any further questions.   Judith Blonder CMA

## 2012-05-25 ENCOUNTER — Inpatient Hospital Stay (HOSPITAL_COMMUNITY): Payer: Medicaid Other | Admitting: Anesthesiology

## 2012-05-25 ENCOUNTER — Inpatient Hospital Stay (HOSPITAL_COMMUNITY)
Admission: AD | Admit: 2012-05-25 | Discharge: 2012-05-27 | DRG: 774 | Disposition: A | Discharge status: Home / Self Care | Payer: Medicaid Other | Source: Ambulatory Visit | Attending: Obstetrics & Gynecology | Admitting: Obstetrics & Gynecology

## 2012-05-25 ENCOUNTER — Encounter (HOSPITAL_COMMUNITY): Payer: Self-pay | Admitting: Anesthesiology

## 2012-05-25 ENCOUNTER — Encounter (HOSPITAL_COMMUNITY): Payer: Self-pay | Admitting: *Deleted

## 2012-05-25 ENCOUNTER — Ambulatory Visit (INDEPENDENT_AMBULATORY_CARE_PROVIDER_SITE_OTHER): Payer: Medicaid Other | Admitting: Obstetrics and Gynecology

## 2012-05-25 ENCOUNTER — Encounter: Payer: Self-pay | Admitting: Obstetrics and Gynecology

## 2012-05-25 VITALS — BP 153/96 | Temp 99.1°F | Wt 191.7 lb

## 2012-05-25 DIAGNOSIS — IMO0002 Reserved for concepts with insufficient information to code with codable children: Secondary | ICD-10-CM | POA: Diagnosis present

## 2012-05-25 DIAGNOSIS — Z3490 Encounter for supervision of normal pregnancy, unspecified, unspecified trimester: Secondary | ICD-10-CM

## 2012-05-25 DIAGNOSIS — O139 Gestational [pregnancy-induced] hypertension without significant proteinuria, unspecified trimester: Secondary | ICD-10-CM

## 2012-05-25 DIAGNOSIS — O169 Unspecified maternal hypertension, unspecified trimester: Secondary | ICD-10-CM

## 2012-05-25 DIAGNOSIS — O0930 Supervision of pregnancy with insufficient antenatal care, unspecified trimester: Secondary | ICD-10-CM

## 2012-05-25 DIAGNOSIS — O99892 Other specified diseases and conditions complicating childbirth: Secondary | ICD-10-CM | POA: Diagnosis present

## 2012-05-25 DIAGNOSIS — Z22330 Carrier of Group B streptococcus: Secondary | ICD-10-CM

## 2012-05-25 DIAGNOSIS — O480 Post-term pregnancy: Secondary | ICD-10-CM

## 2012-05-25 LAB — POCT URINALYSIS DIP (DEVICE)
Ketones, ur: NEGATIVE mg/dL
Leukocytes, UA: NEGATIVE
Protein, ur: NEGATIVE mg/dL
pH: 7 (ref 5.0–8.0)

## 2012-05-25 LAB — CBC
HCT: 36.8 % (ref 36.0–46.0)
HCT: 38.9 % (ref 36.0–46.0)
Hemoglobin: 13 g/dL (ref 12.0–15.0)
MCH: 29 pg (ref 26.0–34.0)
MCH: 29.2 pg (ref 26.0–34.0)
MCHC: 33.2 g/dL (ref 30.0–36.0)
MCV: 87.4 fL (ref 78.0–100.0)
RBC: 4.45 MIL/uL (ref 3.87–5.11)
RDW: 14.9 % (ref 11.5–15.5)

## 2012-05-25 LAB — COMPREHENSIVE METABOLIC PANEL
AST: 17 U/L (ref 0–37)
Albumin: 3.5 g/dL (ref 3.5–5.2)
Alkaline Phosphatase: 123 U/L — ABNORMAL HIGH (ref 39–117)
Glucose, Bld: 73 mg/dL (ref 70–99)
Sodium: 135 mEq/L (ref 135–145)
Total Bilirubin: 0.6 mg/dL (ref 0.3–1.2)

## 2012-05-25 MED ORDER — OXYCODONE-ACETAMINOPHEN 5-325 MG PO TABS: 1.0000 | ORAL_TABLET | ORAL | Status: DC | PRN

## 2012-05-25 MED ORDER — LACTATED RINGERS IV SOLN
500.0000 mL | Freq: Once | INTRAVENOUS | Status: AC
  Administered 2012-05-25: 500 mL via INTRAVENOUS

## 2012-05-25 MED ORDER — ZOLPIDEM TARTRATE 5 MG PO TABS: 5.0000 mg | ORAL_TABLET | Freq: Every evening | ORAL | Status: DC | PRN

## 2012-05-25 MED ORDER — FLEET ENEMA 7-19 GM/118ML RE ENEM: 1.0000 | ENEMA | RECTAL | Status: DC | PRN

## 2012-05-25 MED ORDER — OXYTOCIN 40 UNITS IN LACTATED RINGERS INFUSION - SIMPLE MED
1.0000 m[IU]/min | INTRAVENOUS | Status: DC
  Administered 2012-05-25: 1 m[IU]/min via INTRAVENOUS
  Filled 2012-05-25: qty 1000

## 2012-05-25 MED ORDER — PHENYLEPHRINE 40 MCG/ML (10ML) SYRINGE FOR IV PUSH (FOR BLOOD PRESSURE SUPPORT): 80.0000 ug | PREFILLED_SYRINGE | INTRAVENOUS | Status: DC | PRN

## 2012-05-25 MED ORDER — MISOPROSTOL 25 MCG QUARTER TABLET
25.0000 ug | ORAL_TABLET | ORAL | Status: DC | PRN
  Administered 2012-05-25 (×2): 25 ug via VAGINAL
  Filled 2012-05-25 (×2): qty 0.25

## 2012-05-25 MED ORDER — LACTATED RINGERS IV SOLN
INTRAVENOUS | Status: DC
  Administered 2012-05-25: 100 mL/h via INTRAVENOUS
  Administered 2012-05-25 – 2012-05-26 (×3): via INTRAVENOUS

## 2012-05-25 MED ORDER — ACETAMINOPHEN 500 MG PO TABS
1000.0000 mg | ORAL_TABLET | Freq: Once | ORAL | Status: AC
  Administered 2012-05-25: 1000 mg via ORAL
  Filled 2012-05-25: qty 2

## 2012-05-25 MED ORDER — EPHEDRINE 5 MG/ML INJ
10.0000 mg | INTRAVENOUS | Status: DC | PRN
  Filled 2012-05-25: qty 4

## 2012-05-25 MED ORDER — PHENYLEPHRINE 40 MCG/ML (10ML) SYRINGE FOR IV PUSH (FOR BLOOD PRESSURE SUPPORT)
80.0000 ug | PREFILLED_SYRINGE | INTRAVENOUS | Status: DC | PRN
  Filled 2012-05-25: qty 5

## 2012-05-25 MED ORDER — MAGNESIUM SULFATE BOLUS VIA INFUSION
4.0000 g | Freq: Once | INTRAVENOUS | Status: DC
  Filled 2012-05-25 (×2): qty 500

## 2012-05-25 MED ORDER — OXYTOCIN BOLUS FROM INFUSION
250.0000 mL | Freq: Once | INTRAVENOUS | Status: DC
  Filled 2012-05-25: qty 500

## 2012-05-25 MED ORDER — IBUPROFEN 600 MG PO TABS: 600.0000 mg | ORAL_TABLET | Freq: Four times a day (QID) | ORAL | Status: DC | PRN

## 2012-05-25 MED ORDER — OXYTOCIN 40 UNITS IN LACTATED RINGERS INFUSION - SIMPLE MED: 62.5000 mL/h | Freq: Once | INTRAVENOUS | Status: DC

## 2012-05-25 MED ORDER — LIDOCAINE HCL (PF) 1 % IJ SOLN
INTRAMUSCULAR | Status: DC | PRN
  Administered 2012-05-25 (×2): 4 mL

## 2012-05-25 MED ORDER — LACTATED RINGERS IV SOLN: 500.0000 mL | INTRAVENOUS | Status: DC | PRN

## 2012-05-25 MED ORDER — EPHEDRINE 5 MG/ML INJ: 10.0000 mg | INTRAVENOUS | Status: DC | PRN

## 2012-05-25 MED ORDER — TERBUTALINE SULFATE 1 MG/ML IJ SOLN: 0.2500 mg | Freq: Once | INTRAMUSCULAR | Status: AC | PRN

## 2012-05-25 MED ORDER — CITRIC ACID-SODIUM CITRATE 334-500 MG/5ML PO SOLN: 30.0000 mL | ORAL | Status: DC | PRN

## 2012-05-25 MED ORDER — LIDOCAINE HCL (PF) 1 % IJ SOLN
30.0000 mL | INTRAMUSCULAR | Status: DC | PRN
  Filled 2012-05-25: qty 30

## 2012-05-25 MED ORDER — FENTANYL 2.5 MCG/ML BUPIVACAINE 1/10 % EPIDURAL INFUSION (WH - ANES)
14.0000 mL/h | INTRAMUSCULAR | Status: DC
  Administered 2012-05-26 (×4): 14 mL/h via EPIDURAL
  Filled 2012-05-25 (×5): qty 60

## 2012-05-25 MED ORDER — FENTANYL 2.5 MCG/ML BUPIVACAINE 1/10 % EPIDURAL INFUSION (WH - ANES)
INTRAMUSCULAR | Status: DC | PRN
  Administered 2012-05-25: 14 mL/h via EPIDURAL

## 2012-05-25 MED ORDER — PENICILLIN G POTASSIUM 5000000 UNITS IJ SOLR
5.0000 10*6.[IU] | Freq: Once | INTRAVENOUS | Status: AC
  Administered 2012-05-25: 5 10*6.[IU] via INTRAVENOUS
  Filled 2012-05-25: qty 5

## 2012-05-25 MED ORDER — DIPHENHYDRAMINE HCL 50 MG/ML IJ SOLN: 12.5000 mg | INTRAMUSCULAR | Status: DC | PRN

## 2012-05-25 MED ORDER — MAGNESIUM SULFATE 40 G IN LACTATED RINGERS - SIMPLE
2.0000 g/h | INTRAVENOUS | Status: DC
  Administered 2012-05-25 – 2012-05-26 (×2): 2 g/h via INTRAVENOUS
  Filled 2012-05-25 (×2): qty 500

## 2012-05-25 MED ORDER — ONDANSETRON HCL 4 MG/2ML IJ SOLN
4.0000 mg | Freq: Four times a day (QID) | INTRAMUSCULAR | Status: DC | PRN
  Administered 2012-05-25: 4 mg via INTRAVENOUS
  Filled 2012-05-25: qty 2

## 2012-05-25 MED ORDER — NALBUPHINE SYRINGE 5 MG/0.5 ML
10.0000 mg | INJECTION | INTRAMUSCULAR | Status: DC | PRN
  Administered 2012-05-25 (×2): 10 mg via INTRAVENOUS
  Filled 2012-05-25 (×2): qty 1

## 2012-05-25 MED ORDER — PENICILLIN G POTASSIUM 5000000 UNITS IJ SOLR
2.5000 10*6.[IU] | INTRAVENOUS | Status: DC
  Administered 2012-05-25 – 2012-05-26 (×6): 2.5 10*6.[IU] via INTRAVENOUS
  Filled 2012-05-25 (×10): qty 2.5

## 2012-05-25 NOTE — Progress Notes (Signed)
[name] [name] is a 21 y.o. G1P0000 at @ [redacted]w[redacted]d by 2nd trimester [place] with late entry to prenatal care presenting for IOL for pre-eclampsia and postdates.   Subjective:   Objective: BP 134/81  Pulse 95  Temp 98.2 F (36.8 C) (Oral)  Resp 18  Ht 5\' 3"  (1.6 m)  Wt 86.637 kg (191 lb)  BMI 33.83 kg/m2  LMP 09/06/2011   Total I/O In: 497.5 [P.O.:120; I.V.:377.5] Out: 295 [Urine:295]  Pulm: LCTAB CV: RRR Abd: nontender Neuro: 3+ reflexes in biceps, patellar with 1 beat of clonus  FHT:  FHR: 140s bpm, variability: moderate,  accelerations:  Present,  decelerations:  Absent UC:   irregular, every 2-8 minutes SVE:   Dilation: 1 Effacement (%): 40 Station: -2 Exam by:: Montez Morita, RNC  Labs: Lab Results  Component Value Date   WBC 9.8 05/25/2012   HGB 12.2 05/25/2012   HCT 36.8 05/25/2012   MCV 87.4 05/25/2012   PLT 165 05/25/2012    Assessment / Plan: 1) IOL  - bishops 4  - cytotec PV 25mg  placed at 1300  - routine admission orders  - epidural prn   2) Pre-eclampsia  - in the setting of elevated BPs and worsening HA not responsive to tylenol at home although now mostly resolved s/p tylenol 1000mg . Normal AST/ALT/PLT.  - spot urine/prot pending  - cont MG 4/2  - q2hour mag checks  - still hyperreflexic but symmetric, no pathologic clonus  3) GBS +  - PCN q4hr- no allergy   4) FWB  - category I tracing  - PCN as above for GBS ppx   5) Anticipate SVD   [name] [name] 05/25/2012, 2:13 PM

## 2012-05-25 NOTE — Progress Notes (Signed)
Constant frontal headaches. Good FM. Membranes swept -> slight blood. EFW  6 1/2 -7. BP recheck is worse. D/W Dr. Macon Large re IOL today. Pt agrees.

## 2012-05-25 NOTE — Progress Notes (Signed)
Iasia Forcier is a 21 y.o. G1P0000 at [redacted]w[redacted]d by 2nd trimester [place] with late entry to prenatal care admitted for induction of labor due to pre-eclampsia and post dates.  Subjective: No complaints. Feeling contractions some but tolerable with nubane.  No LOF, VB. +FM  Headache improved with tylenol.  No vision changes, SOB, RUQ pain.    Objective: BP 118/77  Pulse 75  Temp 98.1 F (36.7 C) (Oral)  Resp 18  Ht 5\' 3"  (1.6 m)  Wt 86.637 kg (191 lb)  BMI 33.83 kg/m2  LMP 09/06/2011 I/O last 3 completed shifts: In: 997.5 [P.O.:120; I.V.:877.5] Out: 495 [Urine:495]    FHT:  FHR: 120s bpm, variability: moderate,  accelerations:  Present,  decelerations:  Absent UC:   irregular, every 2-5 minutes SVE:   Dilation: 2 Effacement (%): 60 Station: -2 Exam by:: [employer]  Labs: Lab Results  Component Value Date   WBC 9.8 05/25/2012   HGB 12.2 05/25/2012   HCT 36.8 05/25/2012   MCV 87.4 05/25/2012   PLT 165 05/25/2012    Assessment / Plan:  1) IOL  - ripening s/p cytotec x1 - will place 2nd dose of cytotec - epidural in active labor  2) Pre-eclampsia  - in the setting of elevated BPs and worsening HA not responsive to tylenol at home although now mostly resolved s/p tylenol 1000mg . Normal AST/ALT/PLT. Prot/cr ration 0.08 - cont MG 4/2  - q2hour mag checks  - still hyperreflexic but symmetric, no pathologic clonus   3) GBS +  - PCN q4hr- no allergy   4) FWB  - category I tracing  - PCN as above for GBS ppx   5) Anticipate SVD   Reola Calkins, Theora Vankirk 05/25/2012, 7:11 PM

## 2012-05-25 NOTE — Patient Instructions (Signed)
Labor Induction  Most women go into labor on their own between 37 and 42 weeks of the pregnancy. When this does not happen or when there is a medical need, medicine or other methods may be used to induce labor. Labor induction causes a pregnant woman's uterus to contract. It also causes the cervix to soften (ripen), open (dilate), and thin out (efface). Usually, labor is not induced before 39 weeks of the pregnancy unless there is a problem with the baby or mother. Whether your labor will be induced depends on a number of factors, including the following:  The medical condition of you and the baby.   How many weeks along you are.   The status of baby's lung maturity.   The condition of the cervix.   The position of the baby.  REASONS FOR LABOR INDUCTION  The health of the baby or mother is at risk.   The pregnancy is overdue by 1 week or more.   The water breaks but labor does not start on its own.   The mother has a health condition or serious illness such as high blood pressure, infection, placental abruption, or diabetes.   The amniotic fluid amounts are low around the baby.   The baby is distressed.  REASONS TO NOT INDUCE LABOR Labor induction may not be a good idea if:  It is shown that your baby does not tolerate labor.   An induction is just more convenient.   You want the baby to be born on a certain date, like a holiday.   You have had previous surgeries on your uterus, such as a myomectomy or the removal of fibroids.   Your placenta lies very low in the uterus and blocks the opening of the cervix (placenta previa).   Your baby is not in a head down position.   The umbilical cord drops down into the birth canal in front of the baby. This could cut off the baby's blood and oxygen supply.   You have had a previous cesarean delivery.   There areunusual circumstances, such as the baby being extremely premature.  RISKS AND COMPLICATIONS Problems may occur in the  process of induction and plans may need to be modified as a situation unfolds. Some of the risks of induction include:  Change in fetal heart rate, such as too high, too low, or erratic.   Risk of fetal distress.   Risk of infection to mother and baby.   Increased chance of having a cesarean delivery.   The rare, but increased chance that the placenta will separate from the uterus (abruption).   Uterine rupture (very rare).  When induction is needed for medical reasons, the benefits of induction may outweigh the risks. BEFORE THE PROCEDURE Your caregiver will check your cervix and the baby's position. This will help your caregiver decide if you are far enough along for an induction to work. PROCEDURE Several methods of labor induction may be used, such as:   Taking prostaglandin medicine to dilate and ripen the cervix. The medicine will also start contractions. It can be taken by mouth or by inserting a suppository into the vagina.   A thin tube (catheter) with a balloon on the end may be inserted into your vagina to dilate the cervix. Once inserted, the balloon expands with water, which causes the cervix to open.   Striping the membranes. Your caregiver inserts a finger between the cervix and membranes, which causes the cervix to be stretched and   may cause the uterus to contract. This is often done during an office visit. You will be sent home to wait for the contractions to begin. You will then come in for an induction.   Breaking the water. Your caregiver will make a hole in the amniotic sac using a small instrument. Once the amniotic sac breaks, contractions should begin. This may still take hours to see an effect.   Taking medicine to trigger or strengthen contractions. This medicine is given intravenously through a tube in your arm.  All of the methods of induction, besides stripping the membranes, will be done in the hospital. Induction is done in the hospital so that you and the  baby can be carefully monitored. AFTER THE PROCEDURE Some inductions can take up to 2 or 3 days. Depending on the cervix, it usually takes less time. It takes longer when you are induced early in the pregnancy or if this is your first pregnancy. If a mother is still pregnant and the induction has been going on for 2 to 3 days, either the mother will be sent home or a cesarean delivery will be needed. Document Released: 02/03/2007 Document Revised: 09/03/2011 Document Reviewed: 07/20/2011 Exit[employer] Patient Information 2012 [employer]. 

## 2012-05-25 NOTE — Progress Notes (Signed)
P = 91 Edema in face and hands Pain in low pelvic area

## 2012-05-25 NOTE — Progress Notes (Signed)
Pt desires IOL @ 41 wks- IOL scheduled 05/30/12 @ 0700.

## 2012-05-25 NOTE — H&P (Addendum)
[name] [name] is a 21 y.o. female G1 @ [redacted]w[redacted]d by 2nd trimester [place] with late entry to prenatal care presenting for IOL for PIH and postdates.  Started having elevated blood pressures starting at around 37 weeks.  Has been also having constant occiput headaches for the last 2 weeks.  Seem to be worsening. Not improved by tylenol.  No vision changes, RUQ pain, weight change.  No VB,LOF, CTX.  +FM.    No CP, SOB, nausea, vomiting, dysuria.  Some orthostasis.    History OB History    Grav Para Term Preterm Abortions TAB SAB Ect Mult Living   1 0 0 0 0 0 0 0 0 0      History reviewed. No pertinent past medical history. Past Surgical History  Procedure Date  . Penny removed from throat     as a child. no scar noted.   Family History: family history includes Hypertension in her mother. Social History:  reports that she has never smoked. She has never used smokeless tobacco. She reports that she does not drink alcohol or use illicit drugs.   Prenatal Transfer Tool  Maternal Diabetes: No Genetic Screening: Normal Maternal Ultrasounds/Referrals: Normal- @[redacted]w[redacted]d /1205gm (45%)/ posterior placenta, AFI WNL, normal anatomy, female Fetal Ultrasounds or other Referrals:  None Maternal Substance Abuse:  No Significant Maternal Medications:  PNV, Amoxicillin Significant Maternal Lab Results: Had +RPR at 1:2 dilution in July, but NEGative FTA-Abs Other Comments:  None  ROS See above in HPI for pertinent positives and negatives.     Blood pressure 160/68, pulse 80, temperature 98.4 F (36.9 C), temperature source Oral, resp. rate 18, height 5\' 3"  (1.6 m), weight 86.637 kg (191 lb), last menstrual period 09/06/2011. Maternal Exam:  Uterine Assessment: Contraction strength is mild.  Contraction frequency is irregular.   Abdomen: Patient reports no abdominal tenderness. Estimated fetal weight is 7#.   Fetal presentation: vertex     Fetal Exam Fetal Monitor Review: Mode: ultrasound.   Baseline  rate: 130s.  Variability: moderate (6-25 bpm).   Pattern: accelerations present and no decelerations.    Fetal State Assessment: Category I - tracings are normal.     Physical Exam  Nursing note and vitals reviewed. Constitutional: She appears well-developed and well-nourished.  HENT:  Head: Normocephalic and atraumatic.  Mouth/Throat: Oropharynx is clear and moist.  Eyes: Conjunctivae and EOM are normal.  Neck: Neck supple.  Cardiovascular: Normal rate, regular rhythm and normal heart sounds.   Respiratory: Effort normal and breath sounds normal.  GI: Soft. Bowel sounds are normal.  Musculoskeletal: Normal range of motion.  Neurological: She has normal reflexes.  Skin: Skin is warm and dry.  Psychiatric: She has a normal mood and affect. Her behavior is normal.    Prenatal labs: ABO, Rh: O/POS/-- (07/31 1112) Antibody: NEG (07/31 1112) Rubella: 359.0 (07/31 1112) RPR: REACTIVE (07/31 1112) but AB negative HBsAg: NEGATIVE (07/31 1112)  HIV: NON REACTIVE (07/31 1112)  GBS: Positive (07/31 0000)  1 hour gtt:  98  Assessment/Plan:  21 yo G1 @ [redacted]w[redacted]d by 2nd trimester [place] with late entry to prenatal care presenting for IOL for pre-eclampsia and postdates.  1) IOL - admit to L&D - bishops 4 - start cytotec PV 25mg  q4 - routine admission orders - epidural prn  2) Pre-eclampsia - in the setting of elevated BPs and worsening HA not responsive to tylenol - start MG 4/2 - q2hour mag checks - treat headache with tylenol initially   3) GBS + - start  PCN q4hr- no allergy  4) FWB - category I tracing - PCN as above for GBS ppx  5) Anticipate SVD  Rulon Abide 05/25/2012, 11:22 AM  Reviewed with Sharen Counter.

## 2012-05-25 NOTE — Anesthesia Preprocedure Evaluation (Signed)

## 2012-05-25 NOTE — H&P (Signed)
Attestation of Attending Supervision of Resident: Evaluation and management procedures were performed by the [facility] Medicine Resident under my supervision.  I have reviewed the resident's note and chart, and I agree with the management and plan under the care of the CNM.  Jaynie Collins, MD, FACOG Attending Obstetrician & Gynecologist Faculty Practice, [facility]

## 2012-05-25 NOTE — Progress Notes (Signed)
   [name] [name] is a 21 y.o. G1P0000 at [redacted]w[redacted]d  admitted for induction of labor due to Pre-eclamptic toxemia of pregnancy..  Subjective:  Comfortable with epidural Objective: BP 127/81  Pulse 73  Temp 98.3 F (36.8 C) (Axillary)  Resp 18  Ht 5\' 3"  (1.6 m)  Wt 86.637 kg (191 lb)  BMI 33.83 kg/m2  SpO2 98%  LMP 09/06/2011 Total I/O In: 1247.1 [P.O.:200; I.V.:947.1; IV Piggyback:100] Out: 900 [Urine:900]  FHT:  FHR: 140 bpm, variability: moderate,  accelerations:  Present,  decelerations:  Absent UC:   irregular, every 2-4 minutes SVE:   Dilation: 3 Effacement (%): 70 Station: -2 Exam by:: Marylee Floras, CNM  Labs: Lab Results  Component Value Date   WBC 12.7* 05/25/2012   HGB 13.0 05/25/2012   HCT 38.9 05/25/2012   MCV 87.4 05/25/2012   PLT 158 05/25/2012  MgS04 @ 2gm/hr  Assessment / Plan: IOL for preeclampsia, ripening phase complete  Labor: no Fetal Wellbeing:  Category I Pain Control:  Epidural Anticipated MOD:  NSVD  [name] [name] Brawley 05/25/2012, 11:19 PM

## 2012-05-26 ENCOUNTER — Encounter (HOSPITAL_COMMUNITY): Payer: Self-pay | Admitting: *Deleted

## 2012-05-26 DIAGNOSIS — O9989 Other specified diseases and conditions complicating pregnancy, childbirth and the puerperium: Secondary | ICD-10-CM

## 2012-05-26 DIAGNOSIS — O99892 Other specified diseases and conditions complicating childbirth: Secondary | ICD-10-CM

## 2012-05-26 DIAGNOSIS — O480 Post-term pregnancy: Secondary | ICD-10-CM

## 2012-05-26 DIAGNOSIS — IMO0002 Reserved for concepts with insufficient information to code with codable children: Secondary | ICD-10-CM

## 2012-05-26 LAB — SYPHILIS: RPR W/REFLEX TO RPR TITER AND TREPONEMAL ANTIBODIES, TRADITIONAL SCREENING AND DIAGNOSIS ALGORITHM: RPR Ser Ql: REACTIVE — AB

## 2012-05-26 LAB — CBC
HCT: 33.6 % — ABNORMAL LOW (ref 36.0–46.0)
MCHC: 32.7 g/dL (ref 30.0–36.0)
RDW: 15.1 % (ref 11.5–15.5)

## 2012-05-26 LAB — MRSA PCR SCREENING: MRSA by PCR: NEGATIVE

## 2012-05-26 LAB — T.PALLIDUM AB, IGG: T pallidum Antibodies (TP-PA): 0.09 S/CO (ref <0.90)

## 2012-05-26 MED ORDER — LACTATED RINGERS IV SOLN
INTRAVENOUS | Status: DC
  Administered 2012-05-26: 100 mL/h via INTRAVENOUS
  Administered 2012-05-27: 06:00:00 via INTRAVENOUS

## 2012-05-26 MED ORDER — WITCH HAZEL-GLYCERIN EX PADS: 1.0000 "application " | MEDICATED_PAD | CUTANEOUS | Status: DC | PRN

## 2012-05-26 MED ORDER — SODIUM CHLORIDE 0.9 % IJ SOLN: 3.0000 mL | Freq: Two times a day (BID) | INTRAMUSCULAR | Status: DC

## 2012-05-26 MED ORDER — SODIUM CHLORIDE 0.9 % IJ SOLN: 3.0000 mL | INTRAMUSCULAR | Status: DC | PRN

## 2012-05-26 MED ORDER — PRENATAL MULTIVITAMIN CH
1.0000 | ORAL_TABLET | Freq: Every day | ORAL | Status: DC
  Administered 2012-05-26 – 2012-05-27 (×2): 1 via ORAL
  Filled 2012-05-26 (×2): qty 1

## 2012-05-26 MED ORDER — MEASLES, MUMPS & RUBELLA VAC ~~LOC~~ INJ
0.5000 mL | INJECTION | Freq: Once | SUBCUTANEOUS | Status: DC
  Filled 2012-05-26: qty 0.5

## 2012-05-26 MED ORDER — OXYCODONE-ACETAMINOPHEN 5-325 MG PO TABS: 1.0000 | ORAL_TABLET | ORAL | Status: DC | PRN

## 2012-05-26 MED ORDER — SODIUM CHLORIDE 0.9 % IV SOLN: 250.0000 mL | INTRAVENOUS | Status: DC | PRN

## 2012-05-26 MED ORDER — ONDANSETRON HCL 4 MG PO TABS: 4.0000 mg | ORAL_TABLET | ORAL | Status: DC | PRN

## 2012-05-26 MED ORDER — ZOLPIDEM TARTRATE 5 MG PO TABS: 5.0000 mg | ORAL_TABLET | Freq: Every evening | ORAL | Status: DC | PRN

## 2012-05-26 MED ORDER — OXYTOCIN 40 UNITS IN LACTATED RINGERS INFUSION - SIMPLE MED: 62.5000 mL/h | INTRAVENOUS | Status: DC | PRN

## 2012-05-26 MED ORDER — SENNOSIDES-DOCUSATE SODIUM 8.6-50 MG PO TABS
2.0000 | ORAL_TABLET | Freq: Every day | ORAL | Status: DC
  Administered 2012-05-26: 2 via ORAL

## 2012-05-26 MED ORDER — DIBUCAINE 1 % RE OINT: 1.0000 "application " | TOPICAL_OINTMENT | RECTAL | Status: DC | PRN

## 2012-05-26 MED ORDER — MAGNESIUM SULFATE 40 G IN LACTATED RINGERS - SIMPLE
1.0000 g/h | INTRAVENOUS | Status: AC
  Filled 2012-05-26: qty 500

## 2012-05-26 MED ORDER — LANOLIN HYDROUS EX OINT: TOPICAL_OINTMENT | CUTANEOUS | Status: DC | PRN

## 2012-05-26 MED ORDER — IBUPROFEN 600 MG PO TABS
600.0000 mg | ORAL_TABLET | Freq: Four times a day (QID) | ORAL | Status: DC
  Administered 2012-05-26 – 2012-05-27 (×5): 600 mg via ORAL
  Filled 2012-05-26 (×5): qty 1

## 2012-05-26 MED ORDER — TETANUS-DIPHTH-ACELL PERTUSSIS 5-2.5-18.5 LF-MCG/0.5 IM SUSP
0.5000 mL | Freq: Once | INTRAMUSCULAR | Status: AC
  Administered 2012-05-27: 0.5 mL via INTRAMUSCULAR
  Filled 2012-05-26: qty 0.5

## 2012-05-26 MED ORDER — FLEET ENEMA 7-19 GM/118ML RE ENEM: 1.0000 | ENEMA | Freq: Every day | RECTAL | Status: DC | PRN

## 2012-05-26 MED ORDER — ONDANSETRON HCL 4 MG/2ML IJ SOLN: 4.0000 mg | INTRAMUSCULAR | Status: DC | PRN

## 2012-05-26 MED ORDER — SIMETHICONE 80 MG PO CHEW: 80.0000 mg | CHEWABLE_TABLET | ORAL | Status: DC | PRN

## 2012-05-26 MED ORDER — DIPHENHYDRAMINE HCL 25 MG PO CAPS: 25.0000 mg | ORAL_CAPSULE | Freq: Four times a day (QID) | ORAL | Status: DC | PRN

## 2012-05-26 MED ORDER — BISACODYL 10 MG RE SUPP: 10.0000 mg | Freq: Every day | RECTAL | Status: DC | PRN

## 2012-05-26 MED ORDER — BENZOCAINE-MENTHOL 20-0.5 % EX AERO
1.0000 "application " | INHALATION_SPRAY | CUTANEOUS | Status: DC | PRN
  Filled 2012-05-26: qty 56

## 2012-05-26 NOTE — Progress Notes (Signed)
[name] [name] is a 21 y.o. G1P0000 at [redacted]w[redacted]d  admitted for induction of labor due to postdates and pre-eclampsia.  Subjective: Comfortable with epidural, denies feeling any pressure. Family supportive at bedside.  Objective: BP 116/74  Pulse 84  Temp 98.4 F (36.9 C) (Axillary)  Resp 16  Ht 5\' 3"  (1.6 m)  Wt 86.637 kg (191 lb)  BMI 33.83 kg/m2  SpO2 98%  LMP 09/06/2011 I/O last 3 completed shifts: In: 3673.1 [P.O.:320; I.V.:2953.1; IV Piggyback:400] Out: 2675 [Urine:2675] Total I/O In: 1109 [P.O.:600; I.V.:409; IV Piggyback:100] Out: 600 [Urine:600]  FHT:  FHR: 120 bpm, variability: minimal ,  accelerations:  Present,  decelerations:  Absent UC:   regular, every 2-4 minutes SVE:   Dilation: 10 Effacement (%): 100 Station: +2 Exam by:: K. Boooker CNM  Labs: Lab Results  Component Value Date   WBC 12.7* 05/25/2012   HGB 13.0 05/25/2012   HCT 38.9 05/25/2012   MCV 87.4 05/25/2012   PLT 158 05/25/2012    Assessment / Plan: Induction of labor due to postdates and pre-eclampsia,  progressing well on pitocin  Labor: Progressing normally Preeclampsia:  on magnesium sulfate and no signs or symptoms of toxicity Fetal Wellbeing:  Category II Pain Control:  Epidural I/D:  n/a Anticipated MOD:  NSVD  Will labor down until feels pressure/urge to push  Marge Duncans 05/26/2012, 12:07 PM

## 2012-05-26 NOTE — Progress Notes (Signed)
[name] [name] is a 21 y.o. G1P0000 at 41w3dadmitted for induction of labor due to preeclampsia.  Subjective: No HA/SOB/RUQ pain  Objective: BP 117/76  Pulse 78  Temp 97.7 F (36.5 C) (Oral)  Resp 18  Ht 5\' 3"  (1.6 m)  Wt 86.637 kg (191 lb)  BMI 33.83 kg/m2  SpO2 98%  LMP 09/06/2011 I/O last 3 completed shifts: In: 1222.5 [P.O.:120; I.V.:1002.5; IV Piggyback:100] Out: 495 [Urine:495] Total I/O In: 2197.8 [P.O.:200; I.V.:1697.8; IV Piggyback:300] Out: 1705 [Urine:1705]  FHT:  FHR: 120 bpm, variability: moderate,  accelerations:  Present,  decelerations:  Absent UC:   regular, every 3-4 minutes SVE:   Dilation: 4 Effacement (%): 70 Station: -1 Exam by:: foley,rn  Labs: Lab Results  Component Value Date   WBC 12.7* 05/25/2012   HGB 13.0 05/25/2012   HCT 38.9 05/25/2012   MCV 87.4 05/25/2012   PLT 158 05/25/2012    Assessment / Plan: Induction of labor due to preeclampsia,  progressing well on pitocin  Labor: Progressing on Pitocin, will continue to increase then AROM Preeclampsia:  on magnesium sulfate and no signs or symptoms of toxicity Fetal Wellbeing:  Category I Pain Control:  Epidural I/D:  PCN for GBS Anticipated MOD:  NSVD  [name] [name] 05/26/2012, 5:55 AM

## 2012-05-26 NOTE — Progress Notes (Signed)
I examined and assessed patient and agree with PA-S plan of care. Cheral Marker, CNM, WHNP-BC

## 2012-05-26 NOTE — Anesthesia Procedure Notes (Signed)
Epidural Patient location during procedure: OB Start time: 05/25/2012 9:14 PM  Staffing Anesthesiologist: Zenobia Kuennen A. Performed by: anesthesiologist   Preanesthetic Checklist Completed: patient identified, site marked, surgical consent, pre-op evaluation, timeout performed, IV checked, risks and benefits discussed and monitors and equipment checked  Epidural Patient position: sitting Prep: site prepped and draped and DuraPrep Patient monitoring: continuous pulse ox and blood pressure Approach: midline Injection technique: LOR air  Needle:  Needle type: Tuohy  Needle gauge: 17 G Needle length: 9 cm and 9 Needle insertion depth: 7 and 7 cm Catheter type: closed end flexible Catheter size: 19 Gauge Catheter at skin depth: 12 cm Test dose: negative and Other  Assessment Events: blood not aspirated, injection not painful, no injection resistance, negative IV test and no paresthesia  Additional Notes Patient identified. Risks and benefits discussed including failed block, incomplete  Pain control, post dural puncture headache, nerve damage, paralysis, blood pressure Changes, nausea, vomiting, reactions to medications-both toxic and allergic and post Partum back pain. All questions were answered. Patient expressed understanding and wished to proceed. Sterile technique was used throughout procedure. Epidural site was Dressed with sterile barrier dressing. No paresthesias, signs of intravascular injection Or signs of intrathecal spread were encountered.  Patient was more comfortable after the epidural was dosed. Please see RN's note for documentation of vital signs and FHR which are stable.

## 2012-05-26 NOTE — Progress Notes (Signed)
Patient ID: [name] [name], female   DOB: 1990/10/12, 21 y.o.   MRN: [id number] Called by RN who states pt reports feeling dizzy, unable to open eyes or get out of bed.  Stat magnesium level ordered= 6.2.  VSS.  Adequate urine output.  Reviewed w/ Tanja Port, CNM and Constant, MD., will decrease magnesium to 1gm/hr.    Cheral Marker, CNM, WHNP-BC

## 2012-05-26 NOTE — Progress Notes (Signed)
   Blenda Wisecup is a 21 y.o. G1P0000 at [redacted]w[redacted]d  admitted for induction of labor due to Pre-eclamptic toxemia of pregnancy..  Subjective: Comfortable with epidural Objective: BP 115/70  Pulse 72  Temp 98.3 F (36.8 C) (Axillary)  Resp 18  Ht 5\' 3"  (1.6 m)  Wt 86.637 kg (191 lb)  BMI 33.83 kg/m2  SpO2 98%  LMP 09/06/2011 Total I/O In: 1463.7 [P.O.:200; I.V.:1063.7; IV Piggyback:200] Out: 950 [Urine:950]  FHT:  FHR: 130 bpm, variability: moderate,  accelerations:  Present,  decelerations:  Absent UC:   irregular, every 2-4 minutes SVE:  Deferred  Labs: Lab Results  Component Value Date   WBC 12.7* 05/25/2012   HGB 13.0 05/25/2012   HCT 38.9 05/25/2012   MCV 87.4 05/25/2012   PLT 158 05/25/2012    Assessment / Plan: IOL for preeclampsia, beginning on Pitocin  Labor: IOL for preeclapmsia, beginning pitocin Fetal Wellbeing:  Category I Pain Control:  Epidural Anticipated MOD:  NSVD  CRESENZO-DISHMAN,Conni Knighton 05/26/2012, 12:47 AM

## 2012-05-26 NOTE — Progress Notes (Signed)
  Subjective:   Patient reports continued comfort with epidural.  Reports "popping"  Objective: BP 118/74  Pulse 79  Temp 98 F (36.7 C) (Oral)  Resp 16  Ht 5\' 3"  (1.6 m)  Wt 86.637 kg (191 lb)  BMI 33.83 kg/m2  SpO2 98%  LMP 09/06/2011 I/O last 3 completed shifts: In: 3673.1 [P.O.:320; I.V.:2953.1; IV Piggyback:400] Out: 2675 [Urine:2675] Total I/O In: 259 [I.V.:159; IV Piggyback:100] Out: 400 [Urine:400]  FHT:  FHR: 135 bpm, variability: minimal ,  accelerations:  Abscent,  decelerations:  Present early UC:   regular, every 3-4 minutes SVE:   Dilation: 5.5 Effacement (%): 100 Station: -2;-1 Exam by:: E. [facility] RNC  Clear fluid noted on towel.  Labs: Lab Results  Component Value Date   WBC 12.7* 05/25/2012   HGB 13.0 05/25/2012   HCT 38.9 05/25/2012   MCV 87.4 05/25/2012   PLT 158 05/25/2012    Assessment / Plan: Augmentation of labor, progressing well  Labor: Progressing normally Preeclampsia:  no signs or symptoms of toxicity and labs stable Fetal Wellbeing:  Category II Pain Control:  Epidural I/D:  n/a Anticipated MOD:  NSVD  Marcelline Mates 05/26/2012, 9:54 AM  I was present for assessment and agree with note above. Maryruth Hancock, CNM

## 2012-05-27 DIAGNOSIS — IMO0002 Reserved for concepts with insufficient information to code with codable children: Secondary | ICD-10-CM

## 2012-05-27 DIAGNOSIS — O480 Post-term pregnancy: Principal | ICD-10-CM

## 2012-05-27 LAB — CBC
HCT: 24.8 % — ABNORMAL LOW (ref 36.0–46.0)
Platelets: 131 10*3/uL — ABNORMAL LOW (ref 150–400)
RDW: 15.3 % (ref 11.5–15.5)
WBC: 10.1 10*3/uL (ref 4.0–10.5)

## 2012-05-27 MED ORDER — FERROUS SULFATE 325 (65 FE) MG PO TABS: 325.0000 mg | ORAL_TABLET | Freq: Two times a day (BID) | ORAL | Status: DC

## 2012-05-27 MED ORDER — IBUPROFEN 600 MG PO TABS: 600.0000 mg | ORAL_TABLET | Freq: Four times a day (QID) | ORAL | Status: AC | PRN

## 2012-05-27 MED ORDER — DOCUSATE SODIUM 100 MG PO CAPS: 100.0000 mg | ORAL_CAPSULE | Freq: Every day | ORAL | Status: AC

## 2012-05-27 MED ORDER — FERROUS SULFATE 325 (65 FE) MG PO TABS
325.0000 mg | ORAL_TABLET | Freq: Two times a day (BID) | ORAL | Status: DC
  Administered 2012-05-27 (×2): 325 mg via ORAL
  Filled 2012-05-27 (×2): qty 1

## 2012-05-27 MED ORDER — NORETHINDRONE 0.35 MG PO TABS: 1.0000 | ORAL_TABLET | Freq: Every day | ORAL | Status: DC

## 2012-05-27 NOTE — Progress Notes (Signed)
UR chart review completed.  

## 2012-05-27 NOTE — Progress Notes (Signed)
Post Partum Day 1 Subjective: no complaints, up ad lib, voiding, tolerating PO and + flatus. Initially last night had some blurry vision but since th mag has been turned down some, she is feeling much better.  No HA, worsening vision changes, RUQ pain or other complaints.   Objective: Blood pressure 108/67, pulse 93, temperature 98 F (36.7 C), temperature source Oral, resp. rate 16, height 5\' 3"  (1.6 m), weight 85.049 kg (187 lb 8 oz), last menstrual period 09/06/2011, SpO2 98.00%, unknown if currently breastfeeding.  Physical Exam:  General: alert, cooperative and no distress Lochia: appropriate Uterine Fundus: firm Incision: n/a DVT Evaluation: No evidence of DVT seen on physical exam. No cords or calf tenderness. No significant calf/ankle edema. NEURO: 3+ reflexes- consistent with previous   Basename 05/27/12 0540 05/26/12 1514  HGB 8.2* 11.0*  HCT 24.8* 33.6*    Assessment/Plan: Plan for discharge tomorrow, Breastfeeding and Contraception wants micronor and plans for mirena at 6 week pp visit. Start BID iron for Hgb of 8.2 Mag now at 1mg /hr- good UOP.  Continue till 24hours pp   LOS: 2 days   Rulon Abide 05/27/2012, 7:41 AM

## 2012-05-27 NOTE — Progress Notes (Signed)
Post Partum Day 1 Subjective: no complaints She denies chest pain, SOB, lightheadedness or dizziness. Patient tolerating po intake  Objective: Blood pressure 108/67, pulse 93, temperature 98 F (36.7 C), temperature source Oral, resp. rate 16, height 5\' 3"  (1.6 m), weight 85.049 kg (187 lb 8 oz), last menstrual period 09/06/2011, SpO2 98.00%, unknown if currently breastfeeding.  Physical Exam:  General: alert, cooperative and no distress Lochia: appropriate Uterine Fundus: firm Incision: n/a DVT Evaluation: No evidence of DVT seen on physical exam. No cords or calf tenderness.   Basename 05/27/12 0540 05/26/12 1514  HGB 8.2* 11.0*  HCT 24.8* 33.6*    Assessment/Plan: Patient doing well on magnesium sulfate No si/sx of magnesium toxicity or worsening PIH Encouraged continued breastfeeding Patient planning on using MIrena IUD for birth control Continue current care   LOS: 2 days   Ison Wichmann 05/27/2012, 7:36 AM

## 2012-05-27 NOTE — Progress Notes (Signed)
Seen also by me Headache improved since Magnesium Sulfate drip being decreased Filed Vitals:   05/27/12 0500 05/27/12 0553 05/27/12 0600 05/27/12 0700  BP: 94/51  104/60 108/67  Pulse: 77  74 93  Temp:      TempSrc:      Resp:      Height:  5\' 3"  (1.6 m)    Weight:  187 lb 8 oz (85.049 kg)    SpO2: 97%  98% 98%    Agree with note  Wynelle Bourgeois CNM 05/27/12

## 2012-05-27 NOTE — Anesthesia Postprocedure Evaluation (Signed)
  Anesthesia Post-op Note  Patient: [name] [name]  Procedure(s) Performed: * No procedures listed *  Patient Location: A-ICU  Anesthesia Type: Epidural  Level of Consciousness: awake and alert   Airway and Oxygen Therapy: Patient Spontanous Breathing  Post-op Pain: none  Post-op Assessment: Patient's Cardiovascular Status Stable, Respiratory Function Stable, Patent Airway, No signs of Nausea or vomiting, Adequate PO intake, Pain level controlled, No headache, No backache, No residual numbness and No residual motor weakness  Post-op Vital Signs: Reviewed and stable  Complications: No apparent anesthesia complications

## 2012-05-27 NOTE — Discharge Summary (Signed)
Obstetric Discharge Summary Reason for Admission: induction of labor for postdates and pre-eclampsia Prenatal Procedures: NST Intrapartum Procedures: spontaneous vaginal delivery and GBS prophylaxis, magnesium sulfate infusion Postpartum Procedures: magnesium IV for 24 hours post partum Complications-Operative and Postpartum: 2nd degree perineal laceration Hemoglobin  Date Value Range Status  05/27/2012 8.2* 12.0 - 15.0 g/dL Final     REPEATED TO VERIFY     DELTA CHECK NOTED     HCT  Date Value Range Status  05/27/2012 24.8* 36.0 - 46.0 % Final    21 yo G1 who presented for induction of labor for postdates and elevated pressures in the setting of headache not resolved with medication. She was started on Magnesium for pre-eclampsia.  Spot prot/cr ratio 0.08, normal CBC and CMP.  Initially had some elevated pressures that resolved after starting mag. GBS was adequately ppx with PCN.  She delivered a liveborn female vaginally on 8/29 @ approximately 14:20.  2nd degree laceration repaired. She was continued on mag for 24 hours post delivery with normal pressures throughout and observed for 4 hours after.  She was discharged home with her newborn both in stable condition.  She was started on iron 325mg  BID for a postpartum hgb of 8.2.    Physical Exam:  General: alert, cooperative, appears stated age and no distress Lochia: appropriate Uterine Fundus: firm Neuro: 3+ reflexes - at baseline for her DVT Evaluation: No evidence of DVT seen on physical exam. No cords or calf tenderness. No significant calf/ankle edema.  Discharge Diagnoses: Term Pregnancy-delivered and pre-eclamplsia, 2nd degree perineal laceration  Discharge Information: Date: 05/27/2012 Activity: unrestricted, pelvic rest Diet: routine Medications: PNV, Ibuprofen and Iron and Micronor Condition: stable Instructions: refer to practice specific booklet Discharge to: home  Follow-up Information    Follow up with  [facility] in 1 week. (for a blood pressure check)    Contact information:   [address] [place] [address] [phone]      Follow up with [facility] in 6 weeks. (for your after delivery visit)    Contact information:   [address] [place] [address] [phone]         Scheduled Meds:   . ferrous sulfate  325 mg Oral BID WC  . ibuprofen  600 mg Oral Q6H  . measles, mumps and rubella vaccine  0.5 mL Subcutaneous Once  . prenatal multivitamin  1 tablet Oral Daily  . senna-docusate  2 tablet Oral QHS  . sodium chloride  3 mL Intravenous Q12H  . TDaP  0.5 mL Intramuscular Once   Continuous Infusions:   . lactated ringers Stopped (05/27/12 1600)  . magnesium sulfate 40 grams in LR 500 mL Stopped (05/27/12 1600)  . oxytocin 40 units in LR 1000 mL Stopped (05/26/12 2057)   PRN Meds:.sodium chloride, benzocaine-Menthol, bisacodyl, dibucaine, diphenhydrAMINE, lanolin, ondansetron (ZOFRAN) IV, ondansetron, oxyCODONE-acetaminophen, oxytocin 40 units in LR 1000 mL, simethicone, sodium chloride, sodium phosphate, witch hazel-glycerin, zolpidem \  Newborn Data: Live born female  Birth Weight: 7 lb 2.8 oz (3255 g) APGAR: 8, 9  Home with mother.  [name] [name] 05/27/2012, 7:10 PM

## 2012-05-29 NOTE — Discharge Summary (Signed)
I saw and examined patient prior to discharge and agree with above. Vitals stable, BP normal range, no headache, RUQ pain, vision changes. Discussed signs and symptoms of preeclampsia and reasons to return to hospital.  Napoleon Form, MD

## 2012-05-30 ENCOUNTER — Inpatient Hospital Stay (HOSPITAL_COMMUNITY): Admission: RE | Admit: 2012-05-30 | Payer: Medicaid Other | Source: Ambulatory Visit

## 2012-06-27 ENCOUNTER — Ambulatory Visit (INDEPENDENT_AMBULATORY_CARE_PROVIDER_SITE_OTHER): Payer: Medicaid Other | Admitting: Obstetrics and Gynecology

## 2012-06-27 ENCOUNTER — Encounter: Payer: Self-pay | Admitting: Obstetrics and Gynecology

## 2012-06-27 NOTE — Progress Notes (Signed)
  Subjective:     [name] [name] is a 21 y.o. female G1P1001 who presents for a postpartum visit. She is 6 weeks postpartum following a spontaneous vaginal delivery. I have fully reviewed the prenatal and intrapartum course. Had 2nd degreee laceration. The delivery was at 40.2 gestational weeks. IOL for mild preE. Outcome: spontaneous vaginal delivery. Anesthesia: spinal. Postpartum course has been uncomplicated. Baby's course has been uncomplicated. Baby is feeding by breast. Bleeding no bleeding. Bowel function is normal. Bladder function is normal. Patient is sexually active. Contraception method is oral progesterone-only contraceptive. She is not planning to be sexually active and has not started but does have the Rx. Postpartum depression screening: negative.  The following portions of the patient's history were reviewed and updated as appropriate: allergies, current medications, past family history, past medical history, past social history, past surgical history and problem list.  Review of Systems Pertinent items are noted in HPI.   Objective:    BP 132/66  Pulse 90  Temp 98.1 F (36.7 C) (Oral)  Ht 5\' 3"  (1.6 m)  Wt 165 lb (74.844 kg)  BMI 29.23 kg/m2  General:  alert, appears stated age and no distress   Breasts:  lactational  Lungs: clear to auscultation bilaterally  Heart:  regular rate and rhythm, S1, S2 normal, no murmur, click, rub or gallop  Abdomen: soft, non-tender; bowel sounds normal; no masses,  no organomegaly   Vulva:  normal  Vagina: normal vagina and vicryl suture noted 2 cm in posterior vagina  Cervix:  no lesions  Corpus: normal  Adnexa:  not evaluated  Rectal Exam: Not performed.       Thyroid not enlarged Assessment:    6 wks postpartum exam. Pap smear not done at today's visit.   Plan:    1. Contraception: oral progesterone-only contraceptive 2. Recheck here 3 months after starting OCPs.  3. Follow up in: 6 months or as needed.

## 2012-06-27 NOTE — Patient Instructions (Addendum)

## 2013-01-25 ENCOUNTER — Encounter: Payer: Self-pay | Admitting: Obstetrics & Gynecology

## 2013-01-26 ENCOUNTER — Ambulatory Visit: Payer: Medicaid Other | Admitting: Obstetrics & Gynecology

## 2013-02-17 ENCOUNTER — Ambulatory Visit (INDEPENDENT_AMBULATORY_CARE_PROVIDER_SITE_OTHER): Payer: Medicaid Other | Admitting: Obstetrics & Gynecology

## 2013-02-17 ENCOUNTER — Encounter: Payer: Self-pay | Admitting: Obstetrics & Gynecology

## 2013-02-17 VITALS — BP 126/76 | Temp 98.1°F | Ht 64.0 in | Wt 179.0 lb

## 2013-02-17 DIAGNOSIS — Z30017 Encounter for initial prescription of implantable subdermal contraceptive: Secondary | ICD-10-CM

## 2013-02-17 DIAGNOSIS — Z3202 Encounter for pregnancy test, result negative: Secondary | ICD-10-CM

## 2013-02-17 DIAGNOSIS — Z309 Encounter for contraceptive management, unspecified: Secondary | ICD-10-CM

## 2013-02-17 MED ORDER — ETONOGESTREL 68 MG ~~LOC~~ IMPL
68.0000 mg | DRUG_IMPLANT | Freq: Once | SUBCUTANEOUS | Status: AC
  Administered 2013-02-17: 68 mg via SUBCUTANEOUS

## 2013-02-17 NOTE — Progress Notes (Signed)
Patient ID: [name] [name], female   DOB: 05-06-1991, 22 y.o.   MRN: [id number] Patient given informed consent, she signed consent form. Pregnancy test was negative.  Appropriate time out taken.  Patient's left arm was prepped and draped in the usual sterile fashion. The ruler used to measure and mark insertion area.  Patient was prepped with alcohol swab and then injected with 5 ml of 1 % lidocaine.  She was prepped with betadine, Nexplanon removed from packaging,  Device confirmed in needle, then inserted full length of needle and withdrawn per handbook instructions.  There was minimal blood loss.  Patient insertion site covered with guaze and a pressure bandage to reduce any bruising.  The patient tolerated the procedure well and was given post procedure instructions. Return in about one month for Nexplanon check.

## 2013-02-17 NOTE — Patient Instructions (Addendum)
Contraceptive Implant Information  A contraceptive implant is a plastic rod that is inserted under the skin. It is usually inserted under the skin of your upper arm. It continually releases small amounts of progestin (synthetic progesterone) into the bloodstream. This prevents an egg from being released from the ovary. It also thickens the cervical mucus to prevent sperm from entering the cervix, and it thins the uterine lining to prevent a fertilized egg from attaching to the uterus. They can be effective for up to 3 years. Implants do not provide protection against sexually transmitted diseases (STDs).   The procedure to insert an implant usually takes about 10 minutes. There may be minor bruising, swelling, and discomfort at the insertion site for a couple days. The implant begins to work within the first day. Other contraceptive protection should be used for 2 weeks. Follow up with your caregiver to get rechecked as directed.  Your caregiver will make sure you are a good candidate for the contraceptive implant. Discuss with your caregiver the possible side effects of the implant  ADVANTAGES  · It prevents pregnancy for up to 3 years.  · It is easily reversible.  · It is convenient.  · The progestins may protect against uterine and ovarian cancer.  · It can be used when breastfeeding.  · It can be used by women who cannot take estrogen.  DISADVANTAGES  · You may have irregular or unplanned vaginal bleeding.  · You may develop side effects, including headache, weight gain, acne, breast tenderness, or mood changes.  · You may have tissue or nerve damage after insertion (rare).  · It may be difficult and uncomfortable to remove.  · Certain medications may interfere with the effectiveness of the implants.   REMOVAL OF IMPLANT  The implant should be removed in 3 years or as directed by your caregiver. The implants effect wears off in a few hours after removal. Your ability to get pregnant (fertility) is restored  within a couple of weeks. New implants can be inserted as soon as the old ones are removed if desired.  DO NOT GET THE IMPLANT IF:   · You are pregnant.  · You have a history of breast cancer, osteoporosis, blood clots, heart disease, diabetes, high blood pressure, liver disease, tumors, or stroke.    · You have undiagnosed vaginal bleeding.  · You have overly sensitive to certain parts of the implant.  Document Released: 09/03/2011 Document Revised: 12/07/2011 Document Reviewed: 09/03/2011  ExitCare® Patient Information ©2014 [employer].

## 2013-02-21 ENCOUNTER — Other Ambulatory Visit: Payer: Self-pay | Admitting: Obstetrics & Gynecology

## 2013-03-17 ENCOUNTER — Ambulatory Visit: Payer: Medicaid Other | Admitting: Obstetrics & Gynecology

## 2013-10-09 ENCOUNTER — Ambulatory Visit: Payer: Self-pay | Admitting: Obstetrics & Gynecology

## 2013-11-06 ENCOUNTER — Encounter: Payer: Self-pay | Admitting: Obstetrics & Gynecology

## 2013-11-06 ENCOUNTER — Ambulatory Visit (INDEPENDENT_AMBULATORY_CARE_PROVIDER_SITE_OTHER): Payer: Medicaid Other | Admitting: Obstetrics & Gynecology

## 2013-11-06 VITALS — BP 156/106 | HR 110 | Temp 98.2°F | Ht 66.0 in | Wt 190.0 lb

## 2013-11-06 DIAGNOSIS — Z309 Encounter for contraceptive management, unspecified: Secondary | ICD-10-CM

## 2013-11-06 NOTE — Patient Instructions (Signed)
Contraception Choices Contraception (birth control) is the use of any methods or devices to prevent pregnancy. Below are some methods to help avoid pregnancy. HORMONAL METHODS   Contraceptive implant This is a thin, plastic tube containing progesterone hormone. It does not contain estrogen hormone. Your health care provider inserts the tube in the inner part of the upper arm. The tube can remain in place for up to 3 years. After 3 years, the implant must be removed. The implant prevents the ovaries from releasing an egg (ovulation), thickens the cervical mucus to prevent sperm from entering the uterus, and thins the lining of the inside of the uterus.  Progesterone-only injections These injections are given every 3 months by your health care provider to prevent pregnancy. This synthetic progesterone hormone stops the ovaries from releasing eggs. It also thickens cervical mucus and changes the uterine lining. This makes it harder for sperm to survive in the uterus.  Birth control pills These pills contain estrogen and progesterone hormone. They work by preventing the ovaries from releasing eggs (ovulation). They also cause the cervical mucus to thicken, preventing the sperm from entering the uterus. Birth control pills are prescribed by a health care provider.Birth control pills can also be used to treat heavy periods.  Minipill This type of birth control pill contains only the progesterone hormone. They are taken every day of each month and must be prescribed by your health care provider.  Birth control patch The patch contains hormones similar to those in birth control pills. It must be changed once a week and is prescribed by a health care provider.  Vaginal ring The ring contains hormones similar to those in birth control pills. It is left in the vagina for 3 weeks, removed for 1 week, and then a new one is put back in place. The patient must be comfortable inserting and removing the ring from the  vagina.A health care provider's prescription is necessary.  Emergency contraception Emergency contraceptives prevent pregnancy after unprotected sexual intercourse. This pill can be taken right after sex or up to 5 days after unprotected sex. It is most effective the sooner you take the pills after having sexual intercourse. Most emergency contraceptive pills are available without a prescription. Check with your pharmacist. Do not use emergency contraception as your only form of birth control. BARRIER METHODS   Female condom This is a thin sheath (latex or rubber) that is worn over the penis during sexual intercourse. It can be used with spermicide to increase effectiveness.  Female condom. This is a soft, loose-fitting sheath that is put into the vagina before sexual intercourse.  Diaphragm This is a soft, latex, dome-shaped barrier that must be fitted by a health care provider. It is inserted into the vagina, along with a spermicidal jelly. It is inserted before intercourse. The diaphragm should be left in the vagina for 6 to 8 hours after intercourse.  Cervical cap This is a round, soft, latex or plastic cup that fits over the cervix and must be fitted by a health care provider. The cap can be left in place for up to 48 hours after intercourse.  Sponge This is a soft, circular piece of polyurethane foam. The sponge has spermicide in it. It is inserted into the vagina after wetting it and before sexual intercourse.  Spermicides These are chemicals that kill or block sperm from entering the cervix and uterus. They come in the form of creams, jellies, suppositories, foam, or tablets. They do not require a   prescription. They are inserted into the vagina with an applicator before having sexual intercourse. The process must be repeated every time you have sexual intercourse. INTRAUTERINE CONTRACEPTION  Intrauterine device (IUD) This is a T-shaped device that is put in a woman's uterus during a  menstrual period to prevent pregnancy. There are 2 types:  Copper IUD This type of IUD is wrapped in copper wire and is placed inside the uterus. Copper makes the uterus and fallopian tubes produce a fluid that kills sperm. It can stay in place for 10 years.  Hormone IUD This type of IUD contains the hormone progestin (synthetic progesterone). The hormone thickens the cervical mucus and prevents sperm from entering the uterus, and it also thins the uterine lining to prevent implantation of a fertilized egg. The hormone can weaken or kill the sperm that get into the uterus. It can stay in place for 3 5 years, depending on which type of IUD is used. PERMANENT METHODS OF CONTRACEPTION  Female tubal ligation This is when the woman's fallopian tubes are surgically sealed, tied, or blocked to prevent the egg from traveling to the uterus.  Hysteroscopic sterilization This involves placing a small coil or insert into each fallopian tube. Your doctor uses a technique called hysteroscopy to do the procedure. The device causes scar tissue to form. This results in permanent blockage of the fallopian tubes, so the sperm cannot fertilize the egg. It takes about 3 months after the procedure for the tubes to become blocked. You must use another form of birth control for these 3 months.  Female sterilization This is when the female has the tubes that carry sperm tied off (vasectomy).This blocks sperm from entering the vagina during sexual intercourse. After the procedure, the man can still ejaculate fluid (semen). NATURAL PLANNING METHODS  Natural family planning This is not having sexual intercourse or using a barrier method (condom, diaphragm, cervical cap) on days the woman could become pregnant.  Calendar method This is keeping track of the length of each menstrual cycle and identifying when you are fertile.  Ovulation method This is avoiding sexual intercourse during ovulation.  Symptothermal method This is  avoiding sexual intercourse during ovulation, using a thermometer and ovulation symptoms.  Post ovulation method This is timing sexual intercourse after you have ovulated. Regardless of which type or method of contraception you choose, it is important that you use condoms to protect against the transmission of sexually transmitted infections (STIs). Talk with your health care provider about which form of contraception is most appropriate for you. Document Released: 09/14/2005 Document Revised: 05/17/2013 Document Reviewed: 03/09/2013 Exit[employer] Patient Information 2014 [employer].  

## 2013-11-06 NOTE — Progress Notes (Signed)
Patient ID: [name] ReamerRachel [name], female   DOB: Feb 19, 1991, 23 y.o.   MRN: [id number] Patient given informed consent, she signed consent form. She has gained weight since her Nexplanon was placed.  She wants it removed.  She reports that she has not been sexually active for 8 months. Appropriate time out taken.  Patient's left arm was prepped and draped in the usual sterile fashion. Patient was prepped with alcohol swab and then injected with 3 ml of 1 % lidocaine.  She was prepped with betadine.  A #15 blade was used to make a small incision over the Nexplanon rod.  Using curved forceps, the end of the rod was grasped and the scar tissue was removed and the device was removed intact.  There was minimal blood loss. The incision site was covered with guaze and a pressure bandage to reduce any bruising.  The patient tolerated the procedure well and was given post procedure instructions.

## 2013-11-20 ENCOUNTER — Encounter: Payer: Self-pay | Admitting: *Deleted

## 2014-07-30 ENCOUNTER — Encounter: Payer: Self-pay | Admitting: Obstetrics & Gynecology

## 2015-01-17 ENCOUNTER — Ambulatory Visit: Payer: Self-pay | Admitting: Obstetrics & Gynecology

## 2018-07-07 DIAGNOSIS — L639 Alopecia areata, unspecified: Secondary | ICD-10-CM | POA: Diagnosis not present

## 2018-07-07 DIAGNOSIS — N926 Irregular menstruation, unspecified: Secondary | ICD-10-CM | POA: Diagnosis not present

## 2018-07-07 DIAGNOSIS — I10 Essential (primary) hypertension: Secondary | ICD-10-CM | POA: Diagnosis not present

## 2018-07-07 DIAGNOSIS — F430 Acute stress reaction: Secondary | ICD-10-CM | POA: Diagnosis not present

## 2018-08-11 DIAGNOSIS — Z01411 Encounter for gynecological examination (general) (routine) with abnormal findings: Secondary | ICD-10-CM | POA: Diagnosis not present

## 2018-08-11 DIAGNOSIS — L639 Alopecia areata, unspecified: Secondary | ICD-10-CM | POA: Diagnosis not present

## 2018-08-11 DIAGNOSIS — Z0000 Encounter for general adult medical examination without abnormal findings: Secondary | ICD-10-CM | POA: Diagnosis not present

## 2018-08-18 DIAGNOSIS — A749 Chlamydial infection, unspecified: Secondary | ICD-10-CM | POA: Diagnosis not present

## 2018-08-18 DIAGNOSIS — N926 Irregular menstruation, unspecified: Secondary | ICD-10-CM | POA: Diagnosis not present

## 2018-10-12 ENCOUNTER — Encounter: Payer: Self-pay | Admitting: Family Medicine

## 2018-10-12 ENCOUNTER — Ambulatory Visit (INDEPENDENT_AMBULATORY_CARE_PROVIDER_SITE_OTHER): Payer: Self-pay

## 2018-10-12 DIAGNOSIS — Z3201 Encounter for pregnancy test, result positive: Secondary | ICD-10-CM

## 2018-10-12 LAB — POCT PREGNANCY, URINE: PREG TEST UR: POSITIVE — AB

## 2018-10-12 NOTE — Progress Notes (Signed)
Pt here today for pregnancy test.  Resulted positive.  Pt denies any bleeding and some mild menstrual like-cramping.  LMP 09/15/18  EDD 06/22/2019.  Medications reconciled.  List of medicines safe to take in pregnancy given.  Front office to provide proof of pregnancy letter to start prenatal care.

## 2018-10-13 NOTE — Progress Notes (Signed)
Patient seen and assessed by nursing staff.  Agree with documentation and plan.  

## 2018-10-26 ENCOUNTER — Inpatient Hospital Stay (HOSPITAL_COMMUNITY): Payer: Medicaid Other | Attending: Student

## 2018-10-26 ENCOUNTER — Inpatient Hospital Stay (HOSPITAL_COMMUNITY)
Admission: AD | Admit: 2018-10-26 | Discharge: 2018-10-26 | Disposition: A | Discharge status: Home / Self Care | Payer: Medicaid Other | Source: Ambulatory Visit | Attending: Obstetrics & Gynecology | Admitting: Obstetrics & Gynecology

## 2018-10-26 ENCOUNTER — Other Ambulatory Visit: Payer: Self-pay

## 2018-10-26 ENCOUNTER — Encounter: Payer: Self-pay | Admitting: Student

## 2018-10-26 DIAGNOSIS — O26891 Other specified pregnancy related conditions, first trimester: Secondary | ICD-10-CM

## 2018-10-26 DIAGNOSIS — Z3A01 Less than 8 weeks gestation of pregnancy: Secondary | ICD-10-CM | POA: Insufficient documentation

## 2018-10-26 DIAGNOSIS — O209 Hemorrhage in early pregnancy, unspecified: Secondary | ICD-10-CM | POA: Diagnosis not present

## 2018-10-26 DIAGNOSIS — O3680X0 Pregnancy with inconclusive fetal viability, not applicable or unspecified: Secondary | ICD-10-CM

## 2018-10-26 DIAGNOSIS — R109 Unspecified abdominal pain: Secondary | ICD-10-CM | POA: Diagnosis not present

## 2018-10-26 LAB — CBC
HCT: 37.5 % (ref 36.0–46.0)
HEMOGLOBIN: 12.4 g/dL (ref 12.0–15.0)
MCH: 29.3 pg (ref 26.0–34.0)
MCHC: 33.1 g/dL (ref 30.0–36.0)
MCV: 88.7 fL (ref 80.0–100.0)
Platelets: 257 10*3/uL (ref 150–400)
RBC: 4.23 MIL/uL (ref 3.87–5.11)
RDW: 13.4 % (ref 11.5–15.5)
WBC: 10.4 10*3/uL (ref 4.0–10.5)
nRBC: 0 % (ref 0.0–0.2)

## 2018-10-26 LAB — WET PREP, GENITAL
CLUE CELLS WET PREP: NONE SEEN
SPERM: NONE SEEN
TRICH WET PREP: NONE SEEN
Yeast Wet Prep HPF POC: NONE SEEN

## 2018-10-26 LAB — URINALYSIS, ROUTINE W REFLEX MICROSCOPIC
Bilirubin Urine: NEGATIVE
Glucose, UA: NEGATIVE mg/dL
Ketones, ur: NEGATIVE mg/dL
Leukocytes, UA: NEGATIVE
Nitrite: NEGATIVE
PROTEIN: NEGATIVE mg/dL
Specific Gravity, Urine: 1.019 (ref 1.005–1.030)
pH: 6 (ref 5.0–8.0)

## 2018-10-26 LAB — ABO/RH: ABO/RH(D): O POS

## 2018-10-26 LAB — HCG, QUANTITATIVE, PREGNANCY: HCG, BETA CHAIN, QUANT, S: 197 m[IU]/mL — AB (ref <5)

## 2018-10-26 NOTE — MAU Note (Signed)
Pt presents to MAU c/o heavy vaginal bleeding. Pt reports this morning around 0730 she noticed some spotting that eventually tappered off today, but then around 1930 tonight she started having heavy vaginal bleeding that was bright red. Pt reports filling one pad and then coming to MAU. Pt reports a 5/10 lower abdominal cramp that has been ongoing today. Pt is tearful because she has a hx of miscarriage.

## 2018-10-26 NOTE — Discharge Instructions (Signed)
Vaginal Bleeding During Pregnancy, First Trimester ° °A small amount of bleeding from the vagina (spotting) is relatively common during early pregnancy. It usually stops on its own. Various things may cause bleeding or spotting during early pregnancy. Some bleeding may be related to the pregnancy, and some may not. In many cases, the bleeding is normal and is not a problem. However, bleeding can also be a sign of something serious. Be sure to tell your health care provider about any vaginal bleeding right away. °Some possible causes of vaginal bleeding during the first trimester include: °· Infection or inflammation of the cervix. °· Growths (polyps) on the cervix. °· Miscarriage or threatened miscarriage. °· Pregnancy tissue developing outside of the uterus (ectopic pregnancy). °· A mass of tissue developing in the uterus due to an egg being fertilized incorrectly (molar pregnancy). °Follow these instructions at home: °Activity °· Follow instructions from your health care provider about limiting your activity. Ask what activities are safe for you. °· If needed, make plans for someone to help with your regular activities. °· Do not have sex or orgasms until your health care provider says that this is safe. °General instructions °· Take over-the-counter and prescription medicines only as told by your health care provider. °· Pay attention to any changes in your symptoms. °· Do not use tampons or douche. °· Write down how many pads you use each day, how often you change pads, and how soaked (saturated) they are. °· If you pass any tissue from your vagina, save the tissue so you can show it to your health care provider. °· Keep all follow-up visits as told by your health care provider. This is important. °Contact a health care provider if: °· You have vaginal bleeding during any part of your pregnancy. °· You have cramps or labor pains. °· You have a fever. °Get help right away if: °· You have severe cramps in your  back or abdomen. °· You pass large clots or a large amount of tissue from your vagina. °· Your bleeding increases. °· You feel light-headed or weak, or you faint. °· You have chills. °· You are leaking fluid or have a gush of fluid from your vagina. °Summary °· A small amount of bleeding (spotting) from the vagina is relatively common during early pregnancy. °· Various things may cause bleeding or spotting in early pregnancy. °· Be sure to tell your health care provider about any vaginal bleeding right away. °This information is not intended to replace advice given to you by your health care provider. Make sure you discuss any questions you have with your health care provider. °Document Released: 06/24/2005 Document Revised: 12/17/2016 Document Reviewed: 12/17/2016 °Elsevier Interactive Patient Education © 2019 Elsevier Inc. ° °

## 2018-10-26 NOTE — MAU Note (Signed)
Ready for MAU provider

## 2018-10-26 NOTE — MAU Provider Note (Signed)
Chief Complaint: Vaginal Bleeding and Abdominal Pain   First Provider Initiated Contact with Patient 10/26/18 2053     SUBJECTIVE HPI: [name] [name] is a 28 y.o. [id number] at 5870w6d by LMP who presents to Maternity Admissions reporting vaginal bleeding and abdominal pain. Symptoms started this morning. Filled up 1 pad with bright red blood. Bleeding has since slowed down.  Denies n/v/d, constipation, vaginal discharge.   Location: abdomen Quality: cramping Severity: 5/10 on pain scale Duration: 1 day Timing: intermittent Modifying factors: none Associated signs and symptoms: vaginal bleeding  No past medical history on file. OB History  Gravida Para Term Preterm AB Living  4 1 1  0 2 1  SAB TAB Ectopic Multiple Live Births  2 0 0 0 1    # Outcome Date GA Lbr Len/2nd Weight Sex Delivery Anes PTL Lv  4 Current           3 SAB 2019          2 SAB 2019          1 Term 05/26/12 408w3d 16:04 / 02:11 3255 g F Vag-Spont EPI  LIV   Past Surgical History:  Procedure Laterality Date  . penny removed from throat     as a child. no scar noted.   Social History   Socioeconomic History  . Marital status: Single    Spouse name: Not on file  . Number of children: Not on file  . Years of education: Not on file  . Highest education level: Not on file  Occupational History  . Not on file  Social Needs  . Financial resource strain: Not on file  . Food insecurity:    Worry: Not on file    Inability: Not on file  . Transportation needs:    Medical: Not on file    Non-medical: Not on file  Tobacco Use  . Smoking status: Never Smoker  . Smokeless tobacco: Never Used  Substance and Sexual Activity  . Alcohol use: No  . Drug use: No  . Sexual activity: Yes  Lifestyle  . Physical activity:    Days per week: Not on file    Minutes per session: Not on file  . Stress: Not on file  Relationships  . Social connections:    Talks on phone: Not on file    Gets together: Not on file     Attends religious service: Not on file    Active member of club or organization: Not on file    Attends meetings of clubs or organizations: Not on file    Relationship status: Not on file  . Intimate partner violence:    Fear of current or ex partner: Not on file    Emotionally abused: Not on file    Physically abused: Not on file    Forced sexual activity: Not on file  Other Topics Concern  . Not on file  Social History Narrative  . Not on file   Family History  Problem Relation Age of Onset  . Hypertension Mother    No current facility-administered medications on file prior to encounter.    Current Outpatient Medications on File Prior to Encounter  Medication Sig Dispense Refill  . Prenatal Vit-Fe Fumarate-FA (MULTIVITAMIN-PRENATAL) 27-0.8 MG TABS tablet Take 1 tablet by mouth daily at 12 noon.     No Known Allergies  I have reviewed patient's Past Medical Hx, Surgical Hx, Family Hx, Social Hx, medications and allergies.   Review of Systems  Constitutional: Negative.   Gastrointestinal: Positive for abdominal pain. Negative for constipation, diarrhea, nausea and vomiting.  Genitourinary: Positive for vaginal bleeding. Negative for dysuria and vaginal discharge.    OBJECTIVE Patient Vitals for the past 24 hrs:  BP Temp Temp src Pulse Resp Weight  10/26/18 2242 (!) 146/97 - - - - -  10/26/18 2049 (!) 144/98 - - 99 - -  10/26/18 2033 (!) 154/95 98.2 F (36.8 C) Oral (!) 105 18 90.7 kg   Constitutional: Well-developed, well-nourished female in no acute distress.  Cardiovascular: normal rate & rhythm, no murmur Respiratory: normal rate and effort. Lung sounds clear throughout GI: Abd soft, non-tender, Pos BS x 4. No guarding or rebound tenderness MS: Extremities nontender, no edema, normal ROM Neurologic: Alert and oriented x 4.  GU:     SPECULUM EXAM: NEFG, physiologic discharge, no blood noted, cervix clean  BIMANUAL: No CMT. cervix closed; uterus normal size, no  adnexal tenderness or masses.    LAB RESULTS Results for orders placed or performed during the hospital encounter of 10/26/18 (from the past 24 hour(s))  Urinalysis, Routine w reflex microscopic     Status: Abnormal   Collection Time: 10/26/18  8:27 PM  Result Value Ref Range   Color, Urine YELLOW YELLOW   APPearance CLEAR CLEAR   Specific Gravity, Urine 1.019 1.005 - 1.030   pH 6.0 5.0 - 8.0   Glucose, UA NEGATIVE NEGATIVE mg/dL   Hgb urine dipstick MODERATE (A) NEGATIVE   Bilirubin Urine NEGATIVE NEGATIVE   Ketones, ur NEGATIVE NEGATIVE mg/dL   Protein, ur NEGATIVE NEGATIVE mg/dL   Nitrite NEGATIVE NEGATIVE   Leukocytes, UA NEGATIVE NEGATIVE   RBC / HPF 0-5 0 - 5 RBC/hpf   WBC, UA 0-5 0 - 5 WBC/hpf   Bacteria, UA RARE (A) NONE SEEN   Squamous Epithelial / LPF 0-5 0 - 5   Mucus PRESENT   CBC     Status: None   Collection Time: 10/26/18  9:13 PM  Result Value Ref Range   WBC 10.4 4.0 - 10.5 K/uL   RBC 4.23 3.87 - 5.11 MIL/uL   Hemoglobin 12.4 12.0 - 15.0 g/dL   HCT 36.0 67.7 - 03.4 %   MCV 88.7 80.0 - 100.0 fL   MCH 29.3 26.0 - 34.0 pg   MCHC 33.1 30.0 - 36.0 g/dL   RDW 03.5 24.8 - 18.5 %   Platelets 257 150 - 400 K/uL   nRBC 0.0 0.0 - 0.2 %  ABO/Rh     Status: None   Collection Time: 10/26/18  9:13 PM  Result Value Ref Range   ABO/RH(D)      O POS Performed at [facility], [address], [place], [place] [address]   hCG, quantitative, pregnancy     Status: Abnormal   Collection Time: 10/26/18  9:13 PM  Result Value Ref Range   hCG, Beta Chain, Quant, S 197 (H) <5 mIU/mL  Wet prep, genital     Status: Abnormal   Collection Time: 10/26/18  9:22 PM  Result Value Ref Range   Yeast Wet Prep HPF POC NONE SEEN NONE SEEN   Trich, Wet Prep NONE SEEN NONE SEEN   Clue Cells Wet Prep HPF POC NONE SEEN NONE SEEN   WBC, Wet Prep HPF POC FEW (A) NONE SEEN   Sperm NONE SEEN     IMAGING US Ob Less Than 14 Weeks With Ob Transvaginal  Result Date: 10/26/2018 CLINICAL  DATA:  Bleeding  EXAM: OBSTETRIC <14 WK [place] AND TRANSVAGINAL OB US TECHNIQUE: Both transabdominal and transvaginal ultrasound examinations were performed for complete evaluation of the gestation as well as the maternal uterus, adnexal regions, and pelvic cul-de-sac. Transvaginal technique was performed to assess early pregnancy. COMPARISON:  None. FINDINGS: Intrauterine gestational sac: Single Yolk sac:  Not visualized Embryo:  Not visualized Cardiac Activity: Not visualized Heart Rate:   bpm MSD: 2.2 mm   4 w   6 d CRL:    mm    w    d                  [place] EDC: Subchorionic hemorrhage:  None visualized. Maternal uterus/adnexae: No adnexal mass or free fluid IMPRESSION: Early intrauterine gestational sac, 4 weeks 6 days by mean sac diameter. No yolk sac or fetal pole currently. This could be followed with repeat ultrasound in 2 weeks to ensure expected progression. No acute maternal findings. Electronically Signed   By: Charlett Nose M.D.   On: 10/26/2018 22:00    MAU COURSE Orders Placed This Encounter  Procedures  . Wet prep, genital  . US OB LESS THAN 14 WEEKS WITH OB TRANSVAGINAL  . Urinalysis, Routine w reflex microscopic  . CBC  . hCG, quantitative, pregnancy  . ABO/Rh  . Discharge patient   No orders of the defined types were placed in this encounter.   MDM +UPT UA, wet prep, GC/chlamydia, CBC, ABO/Rh, quant hCG, and [place] today to rule out ectopic pregnancy Rh positive No blood on exam  Ultrasound shows empty ?IUGS. HCG 197. Will bring patient back on Saturday for repeat HCG   ASSESSMENT 1. Pregnancy of unknown anatomic location   2. Vaginal bleeding in pregnancy, first trimester   3. Abdominal pain during pregnancy in first trimester     PLAN Discharge home in stable condition. SAB vs ectopic precautions Pt to return to MAU on Saturday for HCG - order placed in signed & held GC/CT pending  Follow-up Information    [facility]. Go on 10/29/2018.    Specialty:  Obstetrics and Gynecology Why:  for blood work Contact information: [address] [id number] [place]he[place] Bonito [place] [address] [phone]         Allergies as of 10/26/2018   No Known Allergies     Medication List    TAKE these medications   multivitamin-prenatal 27-0.8 MG Tabs tablet Take 1 tablet by mouth daily at 12 noon.        Judeth Horn, NP 10/26/2018  10:54 PM

## 2018-10-27 LAB — GC/CHLAMYDIA PROBE AMP (~~LOC~~) NOT AT ARMC
Chlamydia: NEGATIVE
NEISSERIA GONORRHEA: NEGATIVE

## 2018-10-29 ENCOUNTER — Inpatient Hospital Stay (HOSPITAL_COMMUNITY)
Admission: AD | Admit: 2018-10-29 | Discharge: 2018-10-29 | Disposition: A | Discharge status: Home / Self Care | Payer: Medicaid Other | Source: Ambulatory Visit | Attending: Obstetrics and Gynecology | Admitting: Obstetrics and Gynecology

## 2018-10-29 DIAGNOSIS — O26891 Other specified pregnancy related conditions, first trimester: Secondary | ICD-10-CM | POA: Insufficient documentation

## 2018-10-29 DIAGNOSIS — Z3A01 Less than 8 weeks gestation of pregnancy: Secondary | ICD-10-CM | POA: Insufficient documentation

## 2018-10-29 DIAGNOSIS — Z5321 Procedure and treatment not carried out due to patient leaving prior to being seen by health care provider: Secondary | ICD-10-CM | POA: Diagnosis not present

## 2018-10-29 LAB — HCG, QUANTITATIVE, PREGNANCY: hCG, Beta Chain, Quant, S: 251 m[IU]/mL — ABNORMAL HIGH (ref <5)

## 2018-10-29 NOTE — MAU Note (Signed)
Patient informed registration that she needs to leave. RN discussed with Donnelly Stager; OK for patient to leave. Will call with results.

## 2018-10-29 NOTE — MAU Note (Signed)
Efrain SellaRachel Matthews is a 28 y.o. at 1159w2d here in MAU reporting: for follow up HCG labs. Pain score: denies Vaginal bleeding: denies Vitals:   10/29/18 0736  BP: (!) 147/85  Pulse: 92  Resp: 18  Temp: 98.2 F (36.8 C)  SpO2: 99%   Lab orders placed from triage: sign and held HCG lab Patient to lobby to await results.

## 2018-11-04 ENCOUNTER — Inpatient Hospital Stay (HOSPITAL_COMMUNITY)
Admission: AD | Admit: 2018-11-04 | Discharge: 2018-11-04 | Disposition: A | Discharge status: Home / Self Care | Payer: Medicaid Other | Attending: Obstetrics & Gynecology | Admitting: Obstetrics & Gynecology

## 2018-11-04 ENCOUNTER — Inpatient Hospital Stay (HOSPITAL_COMMUNITY): Payer: Medicaid Other

## 2018-11-04 ENCOUNTER — Encounter (HOSPITAL_COMMUNITY): Payer: Self-pay | Admitting: *Deleted

## 2018-11-04 DIAGNOSIS — Z3A01 Less than 8 weeks gestation of pregnancy: Secondary | ICD-10-CM | POA: Diagnosis not present

## 2018-11-04 DIAGNOSIS — O3680X0 Pregnancy with inconclusive fetal viability, not applicable or unspecified: Secondary | ICD-10-CM | POA: Diagnosis not present

## 2018-11-04 DIAGNOSIS — O209 Hemorrhage in early pregnancy, unspecified: Secondary | ICD-10-CM | POA: Insufficient documentation

## 2018-11-04 DIAGNOSIS — R1031 Right lower quadrant pain: Secondary | ICD-10-CM | POA: Insufficient documentation

## 2018-11-04 LAB — CBC
HCT: 36.9 % (ref 36.0–46.0)
HEMOGLOBIN: 12.2 g/dL (ref 12.0–15.0)
MCH: 29.3 pg (ref 26.0–34.0)
MCHC: 33.1 g/dL (ref 30.0–36.0)
MCV: 88.7 fL (ref 80.0–100.0)
NRBC: 0 % (ref 0.0–0.2)
Platelets: 266 10*3/uL (ref 150–400)
RBC: 4.16 MIL/uL (ref 3.87–5.11)
RDW: 13.7 % (ref 11.5–15.5)
WBC: 9.6 10*3/uL (ref 4.0–10.5)

## 2018-11-04 LAB — HCG, QUANTITATIVE, PREGNANCY: hCG, Beta Chain, Quant, S: 421 m[IU]/mL — ABNORMAL HIGH (ref <5)

## 2018-11-04 NOTE — MAU Note (Signed)
Pt stated she was here was here on the 10/26/2018. Could not see anything on U/s. Stated she started bleeding heavy today and is c/o pain in her right side and shoulder.

## 2018-11-04 NOTE — MAU Provider Note (Signed)
Chief Complaint: Vaginal Bleeding   First Provider Initiated Contact with Patient 11/04/18 2151      SUBJECTIVE HPI: [name] [name] is a 28 y.o. G4P1021 at [redacted]w[redacted]d by LMP who presents to maternity admissions reporting onset of heavy bleeding with clots, abdominal pain, and right shoulder pain today.  Pt was seen in MAU 1/29 with spotting and followed up 2/1 in MAU for repeat hcg.  Hcg on 1/29 was 197, repeat in 48 hours was 251.  Pt left MAU prior to receiving results but has viewed them in MyChart.  She reports spotting with no pain daily since 1/29 but today bleeding became heavier, soaking 1 pad in 1 hour for at least 1 hour, 1 pad in 2 hours at other times. There were 2-3 large clots which are now becoming smaller.  After bleeding onset, she had lower abdominal pain in the middle, then pain in the RLQ that is intermittent and sharp. There is also sharp constant pain now in her right shoulder. She denies any known injury.  She has not tried any treatments. There are no other symptoms.   HPI  No past medical history on file. Past Surgical History:  Procedure Laterality Date  . penny removed from throat     as a child. no scar noted.   Social History   Socioeconomic History  . Marital status: Single    Spouse name: Not on file  . Number of children: Not on file  . Years of education: Not on file  . Highest education level: Not on file  Occupational History  . Not on file  Social Needs  . Financial resource strain: Not on file  . Food insecurity:    Worry: Not on file    Inability: Not on file  . Transportation needs:    Medical: Not on file    Non-medical: Not on file  Tobacco Use  . Smoking status: Never Smoker  . Smokeless tobacco: Never Used  Substance and Sexual Activity  . Alcohol use: No  . Drug use: No  . Sexual activity: Yes  Lifestyle  . Physical activity:    Days per week: Not on file    Minutes per session: Not on file  . Stress: Not on file  Relationships  .  Social connections:    Talks on phone: Not on file    Gets together: Not on file    Attends religious service: Not on file    Active member of club or organization: Not on file    Attends meetings of clubs or organizations: Not on file    Relationship status: Not on file  . Intimate partner violence:    Fear of current or ex partner: Not on file    Emotionally abused: Not on file    Physically abused: Not on file    Forced sexual activity: Not on file  Other Topics Concern  . Not on file  Social History Narrative  . Not on file   No current facility-administered medications on file prior to encounter.    Current Outpatient Medications on File Prior to Encounter  Medication Sig Dispense Refill  . Prenatal Vit-Fe Fumarate-FA (MULTIVITAMIN-PRENATAL) 27-0.8 MG TABS tablet Take 1 tablet by mouth daily at 12 noon.     No Known Allergies  ROS:  Review of Systems  Constitutional: Negative for chills, fatigue and fever.  Respiratory: Negative for shortness of breath.   Cardiovascular: Negative for chest pain.  Gastrointestinal: Positive for abdominal pain. Negative  for nausea and vomiting.  Genitourinary: Positive for pelvic pain and vaginal bleeding. Negative for difficulty urinating, dysuria, flank pain, vaginal discharge and vaginal pain.  Musculoskeletal: Positive for arthralgias.       Pain in right shoulder  Neurological: Negative for dizziness and headaches.  Psychiatric/Behavioral: Negative.      I have reviewed patient's Past Medical Hx, Surgical Hx, Family Hx, Social Hx, medications and allergies.   Physical Exam   Patient Vitals for the past 24 hrs:  BP Temp Temp src Pulse Resp Height Weight  11/04/18 2324 140/84 98.9 F (37.2 C) Oral (!) 105 17 - -  11/04/18 2322 (!) 147/101 - - (!) 110 - - -  11/04/18 2120 (!) 161/93 98.7 F (37.1 C) - (!) 117 18 5\' 5"  (1.651 m) 91.2 kg   Constitutional: Well-developed, well-nourished female in no acute distress.   Cardiovascular: normal rate Respiratory: normal effort GI: Abd soft, non-tender. Pos BS x 4 MS: Extremities nontender, no edema, normal ROM Neurologic: Alert and oriented x 4.  GU: Neg CVAT.  PELVIC EXAM: Cervix pink, visually closed, without lesion, small amount dark red bleeding, not requiring fox swab to visualize cervix, vaginal walls and external genitalia normal Bimanual exam: Cervix 0/long/high, firm, anterior, neg CMT, uterus mildly tender, nonenlarged, adnexa with tenderness on the right, none on the left, no enlargement or mass bilaterally   LAB RESULTS Results for orders placed or performed during the hospital encounter of 11/04/18 (from the past 24 hour(s))  CBC     Status: None   Collection Time: 11/04/18  9:58 PM  Result Value Ref Range   WBC 9.6 4.0 - 10.5 K/uL   RBC 4.16 3.87 - 5.11 MIL/uL   Hemoglobin 12.2 12.0 - 15.0 g/dL   HCT 04.536.9 40.936.0 - 81.146.0 %   MCV 88.7 80.0 - 100.0 fL   MCH 29.3 26.0 - 34.0 pg   MCHC 33.1 30.0 - 36.0 g/dL   RDW 91.413.7 78.211.5 - 95.615.5 %   Platelets 266 150 - 400 K/uL   nRBC 0.0 0.0 - 0.2 %  hCG, quantitative, pregnancy     Status: Abnormal   Collection Time: 11/04/18  9:58 PM  Result Value Ref Range   hCG, Beta Chain, Quant, S 421 (H) <5 mIU/mL    --/--/O POS Performed at [facility], [address] [place], [place] [address]  [phone](01/29 2113)  IMAGING [place] Ob Transvaginal  Result Date: 11/04/2018 CLINICAL DATA:  28 year old female with vaginal bleeding in the 1st trimester of pregnancy. Estimated gestational age by LMP 5 weeks 5 days. EXAM: TRANSVAGINAL OB ULTRASOUND TECHNIQUE: Transvaginal ultrasound was performed for complete evaluation of the gestation as well as the maternal uterus, adnexal regions, and pelvic cul-de-sac. COMPARISON:  10/26/2018. FINDINGS: Intrauterine gestational sac: Single Yolk sac:  Not visible Embryo:  Not visible Cardiac Activity: Not applicable MSD: 4.0 mm   5 w   1 d Subchorionic hemorrhage:  None visualized.  Maternal uterus/adnexae: The right ovary appears to contain the corpus luteum (image 22) and measures 4.2 x 2.4 x 2.8 centimeters. The left ovary appears normal measuring 3.0 x 2.2 x 2.2 centimeters. No pelvic free fluid. IMPRESSION: Intrauterine gestational sac measuring 5 weeks and 1 day, but as on 10/26/2018 no yolk sac or fetal pole identified. Findings are suspicious but not yet definitive for failed pregnancy. Recommend follow-up US in 10-14 days for definitive diagnosis. This recommendation follows SRU consensus guidelines: Diagnostic Criteria for Nonviable Pregnancy Early in the First Trimester. N  Dola Factorngl J Med 2013; 369:1443-51. Ovaries appear normal. No subchorionic hemorrhage or pelvic free fluid. Electronically Signed   By: Odessa FlemingH  Hall M.D.   On: 11/04/2018 22:37   [place] Ob Less Than 14 Weeks With Ob Transvaginal  Result Date: 10/26/2018 CLINICAL DATA:  Bleeding EXAM: OBSTETRIC <14 WK US AND TRANSVAGINAL OB US TECHNIQUE: Both transabdominal and transvaginal ultrasound examinations were performed for complete evaluation of the gestation as well as the maternal uterus, adnexal regions, and pelvic cul-de-sac. Transvaginal technique was performed to assess early pregnancy. COMPARISON:  None. FINDINGS: Intrauterine gestational sac: Single Yolk sac:  Not visualized Embryo:  Not visualized Cardiac Activity: Not visualized Heart Rate:   bpm MSD: 2.2 mm   4 w   6 d CRL:    mm    w    d                  US EDC: Subchorionic hemorrhage:  None visualized. Maternal uterus/adnexae: No adnexal mass or free fluid IMPRESSION: Early intrauterine gestational sac, 4 weeks 6 days by mean sac diameter. No yolk sac or fetal pole currently. This could be followed with repeat ultrasound in 2 weeks to ensure expected progression. No acute maternal findings. Electronically Signed   By: Charlett NoseKevin  Dover M.D.   On: 10/26/2018 22:00    MAU Management/MDM: Orders Placed This Encounter  Procedures  . US OB Transvaginal  . CBC  . hCG,  quantitative, pregnancy  . Discharge patient    No orders of the defined types were placed in this encounter.   Hcg rising but not appropriately. US shows GS same size as US 6 days ago, suspicious but not definitive for failed pregnancy.  US plus bleeding make SAB more likely but this is still pregnancy of unknown location.  Pt declines pain medication in MAU or as prescription and prefers to take Tylenol at home.  Ectopic precautions/reasons to return given.  Repeat hcg in 48 hours in MAU per pt preference and schedule.  Return to MAU sooner for worsening symptoms.    ASSESSMENT 1. Pregnancy of unknown anatomic location   2. Vaginal bleeding in pregnancy, first trimester     PLAN Discharge home Allergies as of 11/04/2018   No Known Allergies     Medication List    TAKE these medications   multivitamin-prenatal 27-0.8 MG Tabs tablet Take 1 tablet by mouth daily at 12 noon.      Follow-up Information    [facility] Follow up.   Why:  Return to MAU on Sunday, 11/06/18 for repeat labs. Return sooner for worsening symptoms. Contact information: [address] [place] [id number] [phone]          Sharen CounterLisa Leftwich-Kirby Certified Nurse-Midwife 11/05/2018  5:38 AM

## 2019-04-13 ENCOUNTER — Ambulatory Visit (INDEPENDENT_AMBULATORY_CARE_PROVIDER_SITE_OTHER): Payer: Medicaid Other | Admitting: Lactation Services

## 2019-04-13 ENCOUNTER — Other Ambulatory Visit: Payer: Self-pay

## 2019-04-13 VITALS — Wt 200.3 lb

## 2019-04-13 DIAGNOSIS — Z3201 Encounter for pregnancy test, result positive: Secondary | ICD-10-CM

## 2019-04-13 LAB — POCT PREGNANCY, URINE: Preg Test, Ur: POSITIVE — AB

## 2019-04-13 NOTE — Progress Notes (Signed)
Pt miscarried in February and never had another period. Pt reports her Hcg levels went to zero with the miscarriage in February. Pt felt nauseated in June and had a positive Pregnancy test. Pt is unsure of dates. LMP Dec 20. Pt stopped her BCP's in June with positive Pregnancy test.   Pt is experiencing nausua throughout the day around meals. She has some spotting last week that has stopped. She denies cramping or further bleeding. Pt is experiencing extreme itching to her arms and chest at night with no rash noted and would like to discuss with provider.   [place] scheduled for 7/28 @ 12:45 with MFM for dating. Pt to be called to schedule new OB intake and provider visit.

## 2019-04-14 ENCOUNTER — Telehealth: Payer: Self-pay | Admitting: Lactation Services

## 2019-04-14 NOTE — Telephone Encounter (Signed)
-----   Message from Donn Pierini, RN sent at 04/13/2019  4:22 PM EDT ----- Please schedule pt for new OB intake and for first provider visit.

## 2019-04-14 NOTE — Telephone Encounter (Signed)
Spoke with patient about making her intake appointment. She is not sure how far she is, so once she gets her ultrasound she will schedule her appointment.

## 2019-04-25 ENCOUNTER — Ambulatory Visit (HOSPITAL_COMMUNITY)
Admission: RE | Admit: 2019-04-25 | Discharge: 2019-04-25 | Disposition: A | Discharge status: Home / Self Care | Payer: Medicaid Other | Source: Ambulatory Visit | Attending: Obstetrics & Gynecology | Admitting: Obstetrics & Gynecology

## 2019-04-25 ENCOUNTER — Ambulatory Visit (INDEPENDENT_AMBULATORY_CARE_PROVIDER_SITE_OTHER): Payer: Medicaid Other | Admitting: General Practice

## 2019-04-25 ENCOUNTER — Encounter: Payer: Self-pay | Admitting: General Practice

## 2019-04-25 ENCOUNTER — Other Ambulatory Visit: Payer: Self-pay

## 2019-04-25 VITALS — BP 133/62 | HR 88

## 2019-04-25 DIAGNOSIS — O219 Vomiting of pregnancy, unspecified: Secondary | ICD-10-CM

## 2019-04-25 DIAGNOSIS — Z3201 Encounter for pregnancy test, result positive: Secondary | ICD-10-CM | POA: Diagnosis not present

## 2019-04-25 DIAGNOSIS — Z712 Person consulting for explanation of examination or test findings: Secondary | ICD-10-CM

## 2019-04-25 DIAGNOSIS — Z3A08 8 weeks gestation of pregnancy: Secondary | ICD-10-CM | POA: Diagnosis not present

## 2019-04-25 MED ORDER — PROMETHAZINE HCL 25 MG PO TABS: 25.0000 mg | ORAL_TABLET | Freq: Four times a day (QID) | ORAL | 0 refills | Status: DC | PRN

## 2019-04-25 NOTE — Progress Notes (Signed)
Patient presents to office today for viability/dating ultrasound results. Reviewed results with Dr Gerri Lins who finds single living IUP- patient should begin prenatal care.  Informed patient of results, provided pictures, & reviewed dating. Patient verbalized understanding & reports nausea/vomiting- does take PNV. Phenergan Rx sent in per protocol & patient informed. Patient desires to start care here. Reports history of Pre-E and chronic HTN, although her PCP has never placed her on meds. BP 133/62 today. Patient will return for new OB intake & new OB appt.  Koren Bound RN BSN 04/25/19

## 2019-05-03 ENCOUNTER — Ambulatory Visit (INDEPENDENT_AMBULATORY_CARE_PROVIDER_SITE_OTHER): Payer: Medicaid Other | Admitting: *Deleted

## 2019-05-03 ENCOUNTER — Other Ambulatory Visit: Payer: Self-pay

## 2019-05-03 DIAGNOSIS — Z8759 Personal history of other complications of pregnancy, childbirth and the puerperium: Secondary | ICD-10-CM | POA: Insufficient documentation

## 2019-05-03 DIAGNOSIS — O0990 Supervision of high risk pregnancy, unspecified, unspecified trimester: Secondary | ICD-10-CM

## 2019-05-03 DIAGNOSIS — O99210 Obesity complicating pregnancy, unspecified trimester: Secondary | ICD-10-CM | POA: Insufficient documentation

## 2019-05-03 MED ORDER — BLOOD PRESSURE KIT DEVI: 1.0000 | 0 refills | Status: AC | PRN

## 2019-05-03 NOTE — Progress Notes (Signed)
I connected with  [name] [name] on 05/03/19 at  3:30 PM EDT by telephone and verified that I am speaking with the correct person using two identifiers.   I discussed the limitations, risks, security and privacy concerns of performing an evaluation and management service by telephone and the availability of in person appointments. I also discussed with the patient that there may be a patient responsible charge related to this service. The patient expressed understanding and agreed to proceed.  States she was not on blood pressure between pregnancies but her blood pressure was borderline. HX preeclampsia in 1st pregnancy.   Explained we will send her [employer] app- app sent to her while on phone.  Explained we will send a blood pressure cuff to her and the pharmacy that will send it will call her to verify her address.  I also verified her address. Explained we will have her take her blood pressure weekly and enter into the app. Explained she will have some visits in office and some virtually- she already had MyChart app. Reviewed appointment date/ time with her , our location and to wear mask, no visitors. Explained she will have exam, ob bloodwork, hemoglobin a1C, cbg , genetic testing if desired, pap if needed. Explained we will schedule an [place] at 19 weeks and she will get notification via [place]. She voices understanding.   Nadim Malia,RN 05/03/2019  3:31 PM

## 2019-05-04 NOTE — Progress Notes (Signed)
Patient seen and assessed by nursing staff during this encounter. I have reviewed the chart and agree with the documentation and plan.  Aletha Halim, MD 05/04/2019 11:48 AM

## 2019-05-18 ENCOUNTER — Telehealth (INDEPENDENT_AMBULATORY_CARE_PROVIDER_SITE_OTHER): Payer: Medicaid Other | Admitting: Lactation Services

## 2019-05-18 DIAGNOSIS — O209 Hemorrhage in early pregnancy, unspecified: Secondary | ICD-10-CM

## 2019-05-18 NOTE — Telephone Encounter (Signed)
Pt reports she has been bleeding for 2 days. She reports the bleeding will stop overnight and will start bleeding once she is up and about. She is changing a pad once a day. Pt reports bleeding is bright red. She is concerned that this is like her last miscarriage. Pt reports some cramping yesterday, none today.   Pt was advised to go to the Maternity Assessment Unit for evaluation today. She is aware of where to go. Pt voiced understanding for the need for further evaluation.

## 2019-05-22 ENCOUNTER — Encounter: Payer: Self-pay | Admitting: Obstetrics and Gynecology

## 2019-05-22 ENCOUNTER — Other Ambulatory Visit: Payer: Self-pay

## 2019-05-22 ENCOUNTER — Ambulatory Visit (INDEPENDENT_AMBULATORY_CARE_PROVIDER_SITE_OTHER): Payer: Medicaid Other | Admitting: Obstetrics and Gynecology

## 2019-05-22 VITALS — BP 127/97 | HR 106 | Temp 99.2°F | Wt 199.5 lb

## 2019-05-22 DIAGNOSIS — R768 Other specified abnormal immunological findings in serum: Secondary | ICD-10-CM

## 2019-05-22 DIAGNOSIS — O10919 Unspecified pre-existing hypertension complicating pregnancy, unspecified trimester: Secondary | ICD-10-CM

## 2019-05-22 DIAGNOSIS — O99210 Obesity complicating pregnancy, unspecified trimester: Secondary | ICD-10-CM

## 2019-05-22 DIAGNOSIS — O0990 Supervision of high risk pregnancy, unspecified, unspecified trimester: Secondary | ICD-10-CM | POA: Diagnosis not present

## 2019-05-22 DIAGNOSIS — E669 Obesity, unspecified: Secondary | ICD-10-CM

## 2019-05-22 DIAGNOSIS — R7989 Other specified abnormal findings of blood chemistry: Secondary | ICD-10-CM

## 2019-05-22 DIAGNOSIS — O0991 Supervision of high risk pregnancy, unspecified, first trimester: Secondary | ICD-10-CM

## 2019-05-22 DIAGNOSIS — B951 Streptococcus, group B, as the cause of diseases classified elsewhere: Secondary | ICD-10-CM

## 2019-05-22 DIAGNOSIS — O2340 Unspecified infection of urinary tract in pregnancy, unspecified trimester: Secondary | ICD-10-CM

## 2019-05-22 DIAGNOSIS — O09291 Supervision of pregnancy with other poor reproductive or obstetric history, first trimester: Secondary | ICD-10-CM

## 2019-05-22 DIAGNOSIS — O2341 Unspecified infection of urinary tract in pregnancy, first trimester: Secondary | ICD-10-CM

## 2019-05-22 DIAGNOSIS — O10911 Unspecified pre-existing hypertension complicating pregnancy, first trimester: Secondary | ICD-10-CM

## 2019-05-22 DIAGNOSIS — Z3A12 12 weeks gestation of pregnancy: Secondary | ICD-10-CM

## 2019-05-22 DIAGNOSIS — O200 Threatened abortion: Secondary | ICD-10-CM

## 2019-05-22 DIAGNOSIS — O99211 Obesity complicating pregnancy, first trimester: Secondary | ICD-10-CM

## 2019-05-22 DIAGNOSIS — Z8759 Personal history of other complications of pregnancy, childbirth and the puerperium: Secondary | ICD-10-CM

## 2019-05-22 MED ORDER — ASPIRIN EC 81 MG PO TBEC: 81.0000 mg | DELAYED_RELEASE_TABLET | Freq: Every day | ORAL | 10 refills | Status: DC

## 2019-05-22 NOTE — Progress Notes (Addendum)
New OB Note  05/22/2019   [facility]  Chief Complaint: NOB  Transfer of Care Patient: no  History of Present Illness: Ms. [name] [name] is a 28 y.o. G3P1011 @ 12/2 weeks (EDC 3/6, based on 8wk u/s).  Preg complicated by has Insufficient prenatal care; Biological false positive RPR test; GBS (group B streptococcus) UTI complicating pregnancy; Supervision of high risk pregnancy, antepartum; History of pre-eclampsia; Obesity in pregnancy; BMI 30s; and Threatened abortion on their problem list.   Any events prior to today's visit: bleeding and spotting She was using oral contraceptives (estrogen/progesterone) when she conceived.  She has Positive signs or symptoms of miscarriage or preterm labor: just spotting today  ROS: A 12-point review of systems was performed and negative, except as stated in the above HPI.  OBGYN History: As per HPI. OB History  Gravida Para Term Preterm AB Living  3 1 1  0 1 1  SAB TAB Ectopic Multiple Live Births  1 0 0 0 1    # Outcome Date GA Lbr Len/2nd Weight Sex Delivery Anes PTL Lv  3 Current           2 SAB 10/2018          1 Term 05/26/12 544w3d 16:04 / 02:11 7 lb 2.8 oz (3.255 kg) F Vag-Spont EPI  LIV     Birth Comments:  IOL for precclampsia    Any issues with any prior pregnancies: preeclampsia Prior children are healthy, doing well, and without any problems or issues: yes History of pap smears: Yes. Last pap smear last year, negative per patient report   Past Medical History: Past Medical History:  Diagnosis Date  . Pregnancy induced hypertension     Past Surgical History: Past Surgical History:  Procedure Laterality Date  . penny removed from throat     as a child. no scar noted.    Family History:  Family History  Problem Relation Age of Onset  . Hypertension Mother   . Heart disease Mother   . Hypertension Father     Social History:  Social History   Socioeconomic History  . Marital status: Single     Spouse name: Not on file  . Number of children: Not on file  . Years of education: Not on file  . Highest education level: Not on file  Occupational History  . Not on file  Social Needs  . Financial resource strain: Not on file  . Food insecurity    Worry: Never true    Inability: Never true  . Transportation needs    Medical: No    Non-medical: No  Tobacco Use  . Smoking status: Never Smoker  . Smokeless tobacco: Never Used  Substance and Sexual Activity  . Alcohol use: No  . Drug use: No  . Sexual activity: Yes    Birth control/protection: Pill  Lifestyle  . Physical activity    Days per week: Not on file    Minutes per session: Not on file  . Stress: Not on file  Relationships  . Social Musicianconnections    Talks on phone: Not on file    Gets together: Not on file    Attends religious service: Not on file    Active member of club or organization: Not on file    Attends meetings of clubs or organizations: Not on file    Relationship status: Not on file  . Intimate partner violence    Fear of current or ex  partner: Not on file    Emotionally abused: Not on file    Physically abused: Not on file    Forced sexual activity: Not on file  Other Topics Concern  . Not on file  Social History Narrative  . Not on file    Allergy: No Known Allergies  Health Maintenance:  Mammogram Up to Date: not applicable  Current Outpatient Medications: PNV  Physical Exam:   BP (!) 127/97   Pulse (!) 106   Temp 99.2 F (37.3 C)   Wt 199 lb 8 oz (90.5 kg)   LMP 09/15/2018 (LMP Unknown)   BMI 32.20 kg/m  Body mass index is 32.2 kg/m. Contractions: Not present Vag. Bleeding: Scant. Fundal height: not applicable FHTs: 481E  General appearance: Well nourished, well developed female in no acute distress.  Cardiovascular: S1, S2 normal, no murmur, rub or gallop, regular rate and rhythm Respiratory:  Clear to auscultation bilateral. Normal respiratory effort Abdomen:  positive bowel sounds and no masses, hernias; diffusely non tender to palpation, non distended Breasts: patient denies any breast s/s. Neuro/Psych:  Normal mood and affect.  Skin:  Warm and dry.  Lymphatic:  No inguinal lymphadenopathy.   Pelvic exam: is not limited by body habitus EGBUS: within normal limits, Vagina: within normal limits and with no blood in the vault, Cervix: normal appearing cervix without discharge or lesions, closed/long/high, Uterus:  enlarged, c/w 12 week size, and Adnexa:  normal adnexa and no mass, fullness, tenderness  Laboratory: Reviewed  Imaging:  Bedside u/s: SLIUP, +FM, normal AF, FHR 160s  Assessment: pt doing well  Plan: 1. Supervision of high risk pregnancy, antepartum Routine care Anatomy u/s already scheduled bp cuff rx sent in - Comprehensive metabolic panel - Culture, OB Urine - Genetic Screening - Obstetric Panel, Including HIV - Protein / creatinine ratio, urine - TSH - Hemoglobin A1c - aspirin EC 81 MG tablet; Take 1 tablet (81 mg total) by mouth daily.  Dispense: 30 tablet; Refill: 10 - GC/Chlamydia probe amp ([place])not at [facility]  2. Threatened abortion ED precautions given  3. Obesity in pregnancy  4. BMI 30s  5. History of pre-eclampsia Baseline labs today Start low dose asa today  6. Chronic Hypertension 1wk bp check Based on prior BPs outside of pregnancy, will give dx of cHTN  Problem list reviewed and updated.  Follow up in 1 weeks.  The nature of [facility] with multiple MDs and other Advanced Practice Providers was explained to patient; also emphasized that residents, students are part of our team.  >50% of 20 min visit spent on counseling and coordination of care.     Durene Romans MD Attending Center for [facility])

## 2019-05-22 NOTE — Progress Notes (Signed)
Called pt and LM that we have her BP cuff from [place].  If she could please give the office a call back to schedule a time to leave a urine sample and also pick up your BP cuff.

## 2019-05-22 NOTE — Progress Notes (Signed)
Pt states Boyfriends sister has MS , Spinabifida

## 2019-05-22 NOTE — Addendum Note (Signed)
Addended by: Bethanne Ginger on: 05/22/2019 11:21 AM   Modules accepted: Orders

## 2019-05-22 NOTE — Progress Notes (Signed)
Pt left without giving urine, can we schedule her for lab so that we can get her OB Urine culture and the Protein Creatinine test thanks.Marland Kitchen

## 2019-05-23 ENCOUNTER — Encounter: Payer: Self-pay | Admitting: General Practice

## 2019-05-23 ENCOUNTER — Other Ambulatory Visit (HOSPITAL_COMMUNITY)
Admission: RE | Admit: 2019-05-23 | Discharge: 2019-05-23 | Disposition: A | Discharge status: Home / Self Care | Payer: Medicaid Other | Source: Ambulatory Visit | Attending: Obstetrics and Gynecology | Admitting: Obstetrics and Gynecology

## 2019-05-23 ENCOUNTER — Other Ambulatory Visit (INDEPENDENT_AMBULATORY_CARE_PROVIDER_SITE_OTHER): Payer: Medicaid Other

## 2019-05-23 ENCOUNTER — Other Ambulatory Visit: Payer: Self-pay

## 2019-05-23 ENCOUNTER — Telehealth: Payer: Self-pay

## 2019-05-23 DIAGNOSIS — O26891 Other specified pregnancy related conditions, first trimester: Secondary | ICD-10-CM | POA: Insufficient documentation

## 2019-05-23 DIAGNOSIS — N898 Other specified noninflammatory disorders of vagina: Secondary | ICD-10-CM

## 2019-05-23 DIAGNOSIS — O0990 Supervision of high risk pregnancy, unspecified, unspecified trimester: Secondary | ICD-10-CM

## 2019-05-23 NOTE — Progress Notes (Signed)
Pt here today to obtain self swab and leave a urine for OB urine cx and protein/creatinine clearance.   Pt also explained on how to properly take BP at home with [facility] BP cuff.   Pt verbalized understanding.    Mel Almond, RN 05/23/19

## 2019-05-23 NOTE — Progress Notes (Signed)
Spoke with pt in regards to leaving a urine and to pick up BP cuff.  Pt stated that she was currently out of town and will not be back until Thursday.  Pt stated that she will be able to come in 05/25/19 before 10 am.  I explained to the pt that would be fine and that we will place her on the schedule.    Mel Almond, RN 05/23/19

## 2019-05-23 NOTE — Telephone Encounter (Signed)
Called pt to let her know that we need to redo her wet prep & when she comes in on the 27 th to pickup her BP Cuff to do self swab, no answer, left VM.

## 2019-05-24 LAB — COMPREHENSIVE METABOLIC PANEL
ALT: 11 IU/L (ref 0–32)
AST: 14 IU/L (ref 0–40)
Albumin/Globulin Ratio: 1.7 (ref 1.2–2.2)
Albumin: 4.7 g/dL (ref 3.9–5.0)
Alkaline Phosphatase: 34 IU/L — ABNORMAL LOW (ref 39–117)
BUN/Creatinine Ratio: 11 (ref 9–23)
BUN: 7 mg/dL (ref 6–20)
Bilirubin Total: 0.4 mg/dL (ref 0.0–1.2)
CO2: 20 mmol/L (ref 20–29)
Calcium: 10.2 mg/dL (ref 8.7–10.2)
Chloride: 102 mmol/L (ref 96–106)
Creatinine, Ser: 0.64 mg/dL (ref 0.57–1.00)
GFR calc Af Amer: 143 mL/min/{1.73_m2} (ref >59)
GFR calc non Af Amer: 124 mL/min/{1.73_m2} (ref >59)
Globulin, Total: 2.7 g/dL (ref 1.5–4.5)
Glucose: 78 mg/dL (ref 65–99)
Potassium: 4.3 mmol/L (ref 3.5–5.2)
Sodium: 136 mmol/L (ref 134–144)
Total Protein: 7.4 g/dL (ref 6.0–8.5)

## 2019-05-24 LAB — OBSTETRIC PANEL, INCLUDING HIV
Basophils Absolute: 0 10*3/uL (ref 0.0–0.2)
Basos: 0 %
EOS (ABSOLUTE): 0.1 10*3/uL (ref 0.0–0.4)
Eos: 1 %
HIV Screen 4th Generation wRfx: NONREACTIVE
Hematocrit: 35.6 % (ref 34.0–46.6)
Hemoglobin: 11.9 g/dL (ref 11.1–15.9)
Hepatitis B Surface Ag: NEGATIVE
Immature Grans (Abs): 0 10*3/uL (ref 0.0–0.1)
Immature Granulocytes: 0 %
Lymphocytes Absolute: 1.9 10*3/uL (ref 0.7–3.1)
Lymphs: 22 %
MCH: 29.8 pg (ref 26.6–33.0)
MCHC: 33.4 g/dL (ref 31.5–35.7)
MCV: 89 fL (ref 79–97)
Monocytes Absolute: 0.5 10*3/uL (ref 0.1–0.9)
Monocytes: 5 %
Neutrophils Absolute: 6 10*3/uL (ref 1.4–7.0)
Neutrophils: 72 %
Platelets: 230 10*3/uL (ref 150–450)
RBC: 3.99 x10E6/uL (ref 3.77–5.28)
RDW: 12.6 % (ref 11.7–15.4)
RPR Ser Ql: REACTIVE — AB
Rh Factor: POSITIVE
Rubella Antibodies, IGG: 15.7 index (ref >0.99)
WBC: 8.5 10*3/uL (ref 3.4–10.8)

## 2019-05-24 LAB — HEMOGLOBIN A1C
Est. average glucose Bld gHb Est-mCnc: 94 mg/dL
Hgb A1c MFr Bld: 4.9 % (ref 4.8–5.6)

## 2019-05-24 LAB — PROTEIN / CREATININE RATIO, URINE
Creatinine, Urine: 62.5 mg/dL
Protein, Ur: 12.5 mg/dL
Protein/Creat Ratio: 200 mg/g creat (ref 0–200)

## 2019-05-24 LAB — RPR, QUANT+TP ABS (REFLEX)
Rapid Plasma Reagin, Quant: 1:2 {titer} — ABNORMAL HIGH
T Pallidum Abs: NONREACTIVE

## 2019-05-24 LAB — TSH: TSH: 19.3 u[IU]/mL — ABNORMAL HIGH (ref 0.450–4.500)

## 2019-05-24 LAB — AB SCR+ANTIBODY ID: Antibody Screen: POSITIVE — AB

## 2019-05-25 ENCOUNTER — Telehealth: Payer: Self-pay | Admitting: *Deleted

## 2019-05-25 ENCOUNTER — Other Ambulatory Visit: Payer: Medicaid Other

## 2019-05-25 ENCOUNTER — Encounter: Payer: Self-pay | Admitting: Obstetrics and Gynecology

## 2019-05-25 DIAGNOSIS — O10919 Unspecified pre-existing hypertension complicating pregnancy, unspecified trimester: Secondary | ICD-10-CM | POA: Insufficient documentation

## 2019-05-25 DIAGNOSIS — O361990 Maternal care for other isoimmunization, unspecified trimester, not applicable or unspecified: Secondary | ICD-10-CM | POA: Insufficient documentation

## 2019-05-25 DIAGNOSIS — E039 Hypothyroidism, unspecified: Secondary | ICD-10-CM | POA: Insufficient documentation

## 2019-05-25 NOTE — Telephone Encounter (Signed)
I called [name] [name] and explained Dr. Ilda Basset ordered another blood test that will be done from the blood drawn Monday. Also that he would like her to come in for a blood pressure check next week. I explained registrar would contact her with that appointment. She voices understanding. Renate Danh,RN

## 2019-05-25 NOTE — Telephone Encounter (Signed)
-----   Message from Aletha Halim, MD sent at 05/25/2019 11:15 AM EDT ----- Please ask the lab to add on a free t4 to her lab work and have her come in for a one week bp check? Thanks!

## 2019-05-27 LAB — CULTURE, OB URINE

## 2019-05-27 LAB — URINE CULTURE, OB REFLEX

## 2019-05-29 ENCOUNTER — Telehealth: Payer: Self-pay | Admitting: Family Medicine

## 2019-05-29 ENCOUNTER — Other Ambulatory Visit: Payer: Self-pay | Admitting: Obstetrics and Gynecology

## 2019-05-29 DIAGNOSIS — O2340 Unspecified infection of urinary tract in pregnancy, unspecified trimester: Secondary | ICD-10-CM | POA: Insufficient documentation

## 2019-05-29 MED ORDER — CEPHALEXIN 500 MG PO CAPS: 500.0000 mg | ORAL_CAPSULE | Freq: Four times a day (QID) | ORAL | 0 refills | Status: AC

## 2019-05-29 NOTE — Telephone Encounter (Signed)
Spoke to patient about her appointment on 9/1 @ 10:0. Patient instructed to wear a face mask for the entire appointment and no visitors are allowed with her during the visit. Patient screened for covid symptoms and denied having any

## 2019-05-30 ENCOUNTER — Other Ambulatory Visit: Payer: Self-pay

## 2019-05-30 ENCOUNTER — Ambulatory Visit (INDEPENDENT_AMBULATORY_CARE_PROVIDER_SITE_OTHER): Payer: Medicaid Other | Admitting: General Practice

## 2019-05-30 VITALS — BP 133/86 | Ht 66.0 in | Wt 203.0 lb

## 2019-05-30 DIAGNOSIS — R7989 Other specified abnormal findings of blood chemistry: Secondary | ICD-10-CM | POA: Diagnosis not present

## 2019-05-30 DIAGNOSIS — Z0130 Encounter for examination of blood pressure without abnormal findings: Secondary | ICD-10-CM

## 2019-05-30 NOTE — Progress Notes (Signed)
Patient presents to office today for BP check and free T4 lab following recent prenatal visit. Patient denies headaches, dizziness or blurry vision. Patient reports she is feeling stressed this morning after initial high BP. She states she went to a friend's house and she couldn't find the baby's heartbeat with a at home doppler. FHR 164 bpm today- reassurance provided to patient. Repeat BP did improve after. Reviewed BP with Dr Ilda Basset who states patient does have CHTN, she should begin baby aspirin and he will follow up with her after free t4 results. Discussed with patient. She verbalized understanding & had no questions.  Koren Bound RN BSN 05/30/19

## 2019-05-31 ENCOUNTER — Encounter: Payer: Self-pay | Admitting: *Deleted

## 2019-05-31 LAB — T4, FREE: Free T4: 0.61 ng/dL — ABNORMAL LOW (ref 0.82–1.77)

## 2019-06-01 NOTE — Progress Notes (Signed)
Patient seen and assessed by nursing staff during this encounter. I have reviewed the chart and agree with the documentation and plan.  Lauralie Blacksher, MD 06/01/2019 1:17 PM    

## 2019-06-02 ENCOUNTER — Other Ambulatory Visit: Payer: Self-pay | Admitting: Obstetrics and Gynecology

## 2019-06-02 DIAGNOSIS — E039 Hypothyroidism, unspecified: Secondary | ICD-10-CM

## 2019-06-02 DIAGNOSIS — O99280 Endocrine, nutritional and metabolic diseases complicating pregnancy, unspecified trimester: Secondary | ICD-10-CM

## 2019-06-02 MED ORDER — LEVOTHYROXINE SODIUM 150 MCG PO TABS: 150.0000 ug | ORAL_TABLET | Freq: Every day | ORAL | 6 refills | Status: DC

## 2019-06-06 ENCOUNTER — Encounter: Payer: Self-pay | Admitting: *Deleted

## 2019-06-06 NOTE — Progress Notes (Signed)
Called [facility] in regards to pt's wet prep not resulting from 05/23/19.  Received notification from [facility], from Cytology, that pt's results for gc/ch/trich are negative however there was not enough specimen to do the BV and yeast testing.  She explained to me that there are looking to fix the issue because it was during a time that there is an issue in the lab.  I stated that I would docment what is said and I was informed that the results should be resulting.

## 2019-06-07 LAB — CERVICOVAGINAL ANCILLARY ONLY
Chlamydia: NEGATIVE
Neisseria Gonorrhea: NEGATIVE
Trichomonas: NEGATIVE

## 2019-06-18 LAB — SPECIMEN STATUS REPORT

## 2019-06-18 LAB — T4, FREE: Free T4: 0.58 ng/dL — ABNORMAL LOW (ref 0.82–1.77)

## 2019-06-20 ENCOUNTER — Telehealth (INDEPENDENT_AMBULATORY_CARE_PROVIDER_SITE_OTHER): Payer: Medicaid Other | Admitting: Student

## 2019-06-20 ENCOUNTER — Other Ambulatory Visit: Payer: Self-pay

## 2019-06-20 VITALS — BP 135/77 | HR 92

## 2019-06-20 DIAGNOSIS — O10912 Unspecified pre-existing hypertension complicating pregnancy, second trimester: Secondary | ICD-10-CM

## 2019-06-20 DIAGNOSIS — Z3A16 16 weeks gestation of pregnancy: Secondary | ICD-10-CM | POA: Diagnosis not present

## 2019-06-20 DIAGNOSIS — O99282 Endocrine, nutritional and metabolic diseases complicating pregnancy, second trimester: Secondary | ICD-10-CM | POA: Diagnosis not present

## 2019-06-20 DIAGNOSIS — O0990 Supervision of high risk pregnancy, unspecified, unspecified trimester: Secondary | ICD-10-CM

## 2019-06-20 DIAGNOSIS — Z8759 Personal history of other complications of pregnancy, childbirth and the puerperium: Secondary | ICD-10-CM

## 2019-06-20 DIAGNOSIS — O10919 Unspecified pre-existing hypertension complicating pregnancy, unspecified trimester: Secondary | ICD-10-CM

## 2019-06-20 DIAGNOSIS — O2342 Unspecified infection of urinary tract in pregnancy, second trimester: Secondary | ICD-10-CM | POA: Diagnosis not present

## 2019-06-20 DIAGNOSIS — O0992 Supervision of high risk pregnancy, unspecified, second trimester: Secondary | ICD-10-CM | POA: Diagnosis not present

## 2019-06-20 DIAGNOSIS — E039 Hypothyroidism, unspecified: Secondary | ICD-10-CM

## 2019-06-20 NOTE — Progress Notes (Signed)
I connected with@ on 06/20/19 at  9:35 AM EDT by: my chart and verified that I am speaking with the correct person using two identifiers.  Patient is located at home and provider is located at [facility].     The purpose of this virtual visit is to provide medical care while limiting exposure to the novel coronavirus. I discussed the limitations, risks, security and privacy concerns of performing an evaluation and management service by my chart video and the availability of in person appointments. I also discussed with the patient that there may be a patient responsible charge related to this service. By engaging in this virtual visit, you consent to the provision of healthcare.  Additionally, you authorize for your insurance to be billed for the services provided during this visit.  The patient expressed understanding and agreed to proceed.  The following staff members participated in the virtual visit:  Jorje Guild NP    PRENATAL VISIT NOTE  Subjective:  [name] [name] is a 28 y.o. G3P1011 at [redacted]w[redacted]d  for phone visit for ongoing prenatal care.  She is currently monitored for the following issues for this high-risk pregnancy and has Biological false positive RPR test; Supervision of high risk pregnancy, antepartum; History of pre-eclampsia; Obesity in pregnancy; BMI 30s; Threatened abortion; Chronic hypertension during pregnancy, antepartum; Hypothyroidism in pregnancy; Maternal atypical antibody; and UTI in pregnancy on their problem list.  Patient reports no complaints.  Contractions: Not present. Vag. Bleeding: None.  Movement: Present. Denies leaking of fluid.   The following portions of the patient's history were reviewed and updated as appropriate: allergies, current medications, past family history, past medical history, past social history, past surgical history and problem list.   Objective:   Vitals:   06/20/19 0942 06/20/19 1001  BP: (!) 128/102 135/77  Pulse: 98 92   Self-Obtained   Fetal Status:     Movement: Present     Assessment and Plan:  Pregnancy: G3P1011 at [redacted]w[redacted]d 1. Supervision of high risk pregnancy, antepartum -doing well -scheduled for anatomy 10/12, will come in for TSH & urine culture on that day too  2. History of pre-eclampsia -taking baby ASA  3. Urinary tract infection in mother during second trimester of pregnancy  - Culture, OB Urine; Future  4. Hypothyroidism during pregnancy in second trimester  - TSH; Future  Preterm labor symptoms and general obstetric precautions including but not limited to vaginal bleeding, contractions, leaking of fluid and fetal movement were reviewed in detail with the patient.  Return for Needs lab visit on 10/12 when she comes in for her anatomy scan. Needs virtual ROB in 6 wks .  Future Appointments  Date Time Provider [place]  07/10/2019  9:00 AM [facility] [place] [facility]  07/10/2019  9:00 AM [facility] [place] 3 [facility]     Time spent on virtual visit: 10 minutes  Jorje Guild, NP

## 2019-06-20 NOTE — Progress Notes (Signed)
I connected with  Virgilio Frees on 06/20/19 at  9:35 AM EDT by telephone and verified that I am speaking with the correct person using two identifiers.   I discussed the limitations, risks, security and privacy concerns of performing an evaluation and management service by telephone and the availability of in person appointments. I also discussed with the patient that there may be a patient responsible charge related to this service. The patient expressed understanding and agreed to proceed.  [place], CMA 06/20/2019  9:42 AM   "Jelly" like discharge

## 2019-06-20 NOTE — Patient Instructions (Signed)
Contraception Choices Contraception, also called birth control, refers to methods or devices that prevent pregnancy. Hormonal methods Contraceptive implant  A contraceptive implant is a thin, plastic tube that contains a hormone. It is inserted into the upper part of the arm. It can remain in place for up to 3 years. Progestin-only injections Progestin-only injections are injections of progestin, a synthetic form of the hormone progesterone. They are given every 3 months by a health care provider. Birth control pills  Birth control pills are pills that contain hormones that prevent pregnancy. They must be taken once a day, preferably at the same time each day. Birth control patch  The birth control patch contains hormones that prevent pregnancy. It is placed on the skin and must be changed once a week for three weeks and removed on the fourth week. A prescription is needed to use this method of contraception. Vaginal ring  A vaginal ring contains hormones that prevent pregnancy. It is placed in the vagina for three weeks and removed on the fourth week. After that, the process is repeated with a new ring. A prescription is needed to use this method of contraception. Emergency contraceptive Emergency contraceptives prevent pregnancy after unprotected sex. They come in pill form and can be taken up to 5 days after sex. They work best the sooner they are taken after having sex. Most emergency contraceptives are available without a prescription. This method should not be used as your only form of birth control. Barrier methods Female condom  A female condom is a thin sheath that is worn over the penis during sex. Condoms keep sperm from going inside a woman's body. They can be used with a spermicide to increase their effectiveness. They should be disposed after a single use. Female condom  A female condom is a soft, loose-fitting sheath that is put into the vagina before sex. The condom keeps sperm  from going inside a woman's body. They should be disposed after a single use. Diaphragm  A diaphragm is a soft, dome-shaped barrier. It is inserted into the vagina before sex, along with a spermicide. The diaphragm blocks sperm from entering the uterus, and the spermicide kills sperm. A diaphragm should be left in the vagina for 6-8 hours after sex and removed within 24 hours. A diaphragm is prescribed and fitted by a health care provider. A diaphragm should be replaced every 1-2 years, after giving birth, after gaining more than 15 lb (6.8 kg), and after pelvic surgery. Cervical cap  A cervical cap is a round, soft latex or plastic cup that fits over the cervix. It is inserted into the vagina before sex, along with spermicide. It blocks sperm from entering the uterus. The cap should be left in place for 6-8 hours after sex and removed within 48 hours. A cervical cap must be prescribed and fitted by a health care provider. It should be replaced every 2 years. Sponge  A sponge is a soft, circular piece of polyurethane foam with spermicide on it. The sponge helps block sperm from entering the uterus, and the spermicide kills sperm. To use it, you make it wet and then insert it into the vagina. It should be inserted before sex, left in for at least 6 hours after sex, and removed and thrown away within 30 hours. Spermicides Spermicides are chemicals that kill or block sperm from entering the cervix and uterus. They can come as a cream, jelly, suppository, foam, or tablet. A spermicide should be inserted into the   vagina with an applicator at least 19-14 minutes before sex to allow time for it to work. The process must be repeated every time you have sex. Spermicides do not require a prescription. Intrauterine contraception Intrauterine device (IUD) An IUD is a T-shaped device that is put in a woman's uterus. There are two types:  Hormone IUD.This type contains progestin, a synthetic form of the hormone  progesterone. This type can stay in place for 3-5 years.  Copper IUD.This type is wrapped in copper wire. It can stay in place for 10 years.  Permanent methods of contraception Female tubal ligation In this method, a woman's fallopian tubes are sealed, tied, or blocked during surgery to prevent eggs from traveling to the uterus. Hysteroscopic sterilization In this method, a small, flexible insert is placed into each fallopian tube. The inserts cause scar tissue to form in the fallopian tubes and block them, so sperm cannot reach an egg. The procedure takes about 3 months to be effective. Another form of birth control must be used during those 3 months. Female sterilization This is a procedure to tie off the tubes that carry sperm (vasectomy). After the procedure, the man can still ejaculate fluid (semen). Natural planning methods Natural family planning In this method, a couple does not have sex on days when the woman could become pregnant. Calendar method This means keeping track of the length of each menstrual cycle, identifying the days when pregnancy can happen, and not having sex on those days. Ovulation method In this method, a couple avoids sex during ovulation. Symptothermal method This method involves not having sex during ovulation. The woman typically checks for ovulation by watching changes in her temperature and in the consistency of cervical mucus. Post-ovulation method In this method, a couple waits to have sex until after ovulation. Summary  Contraception, also called birth control, means methods or devices that prevent pregnancy.  Hormonal methods of contraception include implants, injections, pills, patches, vaginal rings, and emergency contraceptives.  Barrier methods of contraception can include female condoms, female condoms, diaphragms, cervical caps, sponges, and spermicides.  There are two types of IUDs (intrauterine devices). An IUD can be put in a woman's uterus to  prevent pregnancy for 3-5 years.  Permanent sterilization can be done through a procedure for males, females, or both.  Natural family planning methods involve not having sex on days when the woman could become pregnant. This information is not intended to replace advice given to you by your health care provider. Make sure you discuss any questions you have with your health care provider. Document Released: 09/14/2005 Document Revised: 09/16/2017 Document Reviewed: 10/17/2016 Elsevier Patient Education  [place]. Round Ligament Pain  The round ligament is a cord of muscle and tissue that helps support the uterus. It can become a source of pain during pregnancy if it becomes stretched or twisted as the baby grows. The pain usually begins in the second trimester (13-28 weeks) of pregnancy, and it can come and go until the baby is delivered. It is not a serious problem, and it does not cause harm to the baby. Round ligament pain is usually a short, sharp, and pinching pain, but it can also be a dull, lingering, and aching pain. The pain is felt in the lower side of the abdomen or in the groin. It usually starts deep in the groin and moves up to the outside of the hip area. The pain may occur when you:  Suddenly change position, such as quickly  going from a sitting to standing position.  Roll over in bed.  Cough or sneeze.  Do physical activity. Follow these instructions at home:   Watch your condition for any changes.  When the pain starts, relax. Then try any of these methods to help with the pain: ? Sitting down. ? Flexing your knees up to your abdomen. ? Lying on your side with one pillow under your abdomen and another pillow between your legs. ? Sitting in a warm bath for 15-20 minutes or until the pain goes away.  Take over-the-counter and prescription medicines only as told by your health care provider.  Move slowly when you sit down or stand up.  Avoid long walks if  they cause pain.  Stop or reduce your physical activities if they cause pain.  Keep all follow-up visits as told by your health care provider. This is important. Contact a health care provider if:  Your pain does not go away with treatment.  You feel pain in your back that you did not have before.  Your medicine is not helping. Get help right away if:  You have a fever or chills.  You develop uterine contractions.  You have vaginal bleeding.  You have nausea or vomiting.  You have diarrhea.  You have pain when you urinate. Summary  Round ligament pain is felt in the lower abdomen or groin. It is usually a short, sharp, and pinching pain. It can also be a dull, lingering, and aching pain.  This pain usually begins in the second trimester (13-28 weeks). It occurs because the uterus is stretching with the growing baby, and it is not harmful to the baby.  You may notice the pain when you suddenly change position, when you cough or sneeze, or during physical activity.  Relaxing, flexing your knees to your abdomen, lying on one side, or taking a warm bath may help to get rid of the pain.  Get help from your health care provider if the pain does not go away or if you have vaginal bleeding, nausea, vomiting, diarrhea, or painful urination. This information is not intended to replace advice given to you by your health care provider. Make sure you discuss any questions you have with your health care provider. Document Released: 06/23/2008 Document Revised: 03/02/2018 Document Reviewed: 03/02/2018 Elsevier Patient Education  2020 [employer].

## 2019-06-22 ENCOUNTER — Ambulatory Visit: Payer: Medicaid Other

## 2019-06-26 ENCOUNTER — Encounter: Payer: Self-pay | Admitting: *Deleted

## 2019-06-26 ENCOUNTER — Other Ambulatory Visit: Payer: Self-pay

## 2019-06-26 ENCOUNTER — Ambulatory Visit (INDEPENDENT_AMBULATORY_CARE_PROVIDER_SITE_OTHER): Payer: Medicaid Other | Admitting: *Deleted

## 2019-06-26 VITALS — BP 143/82 | HR 102 | Temp 98.5°F

## 2019-06-26 DIAGNOSIS — R300 Dysuria: Secondary | ICD-10-CM

## 2019-06-26 DIAGNOSIS — O26892 Other specified pregnancy related conditions, second trimester: Secondary | ICD-10-CM

## 2019-06-26 LAB — POCT URINALYSIS DIP (DEVICE)
Bilirubin Urine: NEGATIVE
Glucose, UA: NEGATIVE mg/dL
Ketones, ur: >=160 mg/dL — AB
Nitrite: NEGATIVE
Protein, ur: NEGATIVE mg/dL
Specific Gravity, Urine: >=1.03 (ref 1.005–1.030)
Urobilinogen, UA: 0.2 mg/dL (ref 0.0–1.0)
pH: 6.5 (ref 5.0–8.0)

## 2019-06-26 NOTE — Progress Notes (Addendum)
Burning when I urinate since last wk. I am getting up again during the night to urinate which had stopped. Denies fever or chills. Pt states she is eating and drinking like normal. Reports drinking a gallon of water today which is her norm. Will culture urine. PT had to leave to pick up her child before RN could talk with Butch Penny NP. Pt has My Chart so will check that for results and send in message if symptoms worsen. No further orders from Butch Penny NP at this time.  Chart reviewed for nurse visit. Agree with plan of care.   Virginia Rochester, NP 06/26/2019 7:43 PM

## 2019-06-28 LAB — URINE CULTURE

## 2019-07-05 ENCOUNTER — Encounter (HOSPITAL_COMMUNITY): Payer: Self-pay

## 2019-07-10 ENCOUNTER — Ambulatory Visit (HOSPITAL_COMMUNITY)
Admission: RE | Admit: 2019-07-10 | Discharge: 2019-07-10 | Disposition: A | Discharge status: Home / Self Care | Payer: Medicaid Other | Source: Ambulatory Visit | Attending: Obstetrics and Gynecology | Admitting: Obstetrics and Gynecology

## 2019-07-10 ENCOUNTER — Other Ambulatory Visit: Payer: Self-pay

## 2019-07-10 ENCOUNTER — Ambulatory Visit (HOSPITAL_COMMUNITY): Payer: Medicaid Other | Admitting: *Deleted

## 2019-07-10 ENCOUNTER — Other Ambulatory Visit (HOSPITAL_COMMUNITY): Payer: Self-pay | Admitting: *Deleted

## 2019-07-10 ENCOUNTER — Encounter (HOSPITAL_COMMUNITY): Payer: Self-pay | Admitting: *Deleted

## 2019-07-10 DIAGNOSIS — O10012 Pre-existing essential hypertension complicating pregnancy, second trimester: Secondary | ICD-10-CM

## 2019-07-10 DIAGNOSIS — O0990 Supervision of high risk pregnancy, unspecified, unspecified trimester: Secondary | ICD-10-CM

## 2019-07-10 DIAGNOSIS — O10919 Unspecified pre-existing hypertension complicating pregnancy, unspecified trimester: Secondary | ICD-10-CM

## 2019-07-10 DIAGNOSIS — O99212 Obesity complicating pregnancy, second trimester: Secondary | ICD-10-CM | POA: Diagnosis not present

## 2019-07-10 DIAGNOSIS — O99282 Endocrine, nutritional and metabolic diseases complicating pregnancy, second trimester: Secondary | ICD-10-CM

## 2019-07-10 DIAGNOSIS — O99210 Obesity complicating pregnancy, unspecified trimester: Secondary | ICD-10-CM

## 2019-07-10 DIAGNOSIS — Z8759 Personal history of other complications of pregnancy, childbirth and the puerperium: Secondary | ICD-10-CM | POA: Diagnosis not present

## 2019-07-10 DIAGNOSIS — Z3A19 19 weeks gestation of pregnancy: Secondary | ICD-10-CM | POA: Diagnosis not present

## 2019-07-10 DIAGNOSIS — E039 Hypothyroidism, unspecified: Secondary | ICD-10-CM

## 2019-07-31 ENCOUNTER — Telehealth: Payer: Self-pay | Admitting: Obstetrics and Gynecology

## 2019-07-31 NOTE — Telephone Encounter (Signed)
Called the patient to confirm the upcoming visit. The patient answered no to the covid screening questions. Advised the patient of no visitors or children are permitted due to covid restrictions. The patient verbalized understanding. °

## 2019-08-01 ENCOUNTER — Ambulatory Visit (INDEPENDENT_AMBULATORY_CARE_PROVIDER_SITE_OTHER): Payer: Medicaid Other | Admitting: Obstetrics and Gynecology

## 2019-08-01 ENCOUNTER — Encounter: Payer: Self-pay | Admitting: Obstetrics and Gynecology

## 2019-08-01 ENCOUNTER — Other Ambulatory Visit: Payer: Self-pay

## 2019-08-01 VITALS — BP 145/81 | HR 98 | Wt 209.2 lb

## 2019-08-01 DIAGNOSIS — E039 Hypothyroidism, unspecified: Secondary | ICD-10-CM

## 2019-08-01 DIAGNOSIS — Z8759 Personal history of other complications of pregnancy, childbirth and the puerperium: Secondary | ICD-10-CM

## 2019-08-01 DIAGNOSIS — R768 Other specified abnormal immunological findings in serum: Secondary | ICD-10-CM

## 2019-08-01 DIAGNOSIS — O361920 Maternal care for other isoimmunization, second trimester, not applicable or unspecified: Secondary | ICD-10-CM

## 2019-08-01 DIAGNOSIS — O99282 Endocrine, nutritional and metabolic diseases complicating pregnancy, second trimester: Secondary | ICD-10-CM

## 2019-08-01 DIAGNOSIS — Z3A22 22 weeks gestation of pregnancy: Secondary | ICD-10-CM

## 2019-08-01 DIAGNOSIS — O09292 Supervision of pregnancy with other poor reproductive or obstetric history, second trimester: Secondary | ICD-10-CM

## 2019-08-01 DIAGNOSIS — O0992 Supervision of high risk pregnancy, unspecified, second trimester: Secondary | ICD-10-CM

## 2019-08-01 DIAGNOSIS — O10912 Unspecified pre-existing hypertension complicating pregnancy, second trimester: Secondary | ICD-10-CM

## 2019-08-01 DIAGNOSIS — O10919 Unspecified pre-existing hypertension complicating pregnancy, unspecified trimester: Secondary | ICD-10-CM

## 2019-08-01 DIAGNOSIS — O0990 Supervision of high risk pregnancy, unspecified, unspecified trimester: Secondary | ICD-10-CM | POA: Diagnosis not present

## 2019-08-01 NOTE — Progress Notes (Signed)
Subjective:  [name] [name] is a 28 y.o. G3P1011 at [redacted]w[redacted]d being seen today for ongoing prenatal care.  She is currently monitored for the following issues for this high-risk pregnancy and has Biological false positive RPR test; Supervision of high risk pregnancy, antepartum; History of pre-eclampsia; Obesity in pregnancy; BMI 30s; Chronic hypertension during pregnancy, antepartum; Hypothyroidism in pregnancy; Maternal atypical antibody; and UTI in pregnancy on their problem list.  Patient reports no complaints.  Contractions: Not present. Vag. Bleeding: None.  Movement: Present. Denies leaking of fluid.   The following portions of the patient's history were reviewed and updated as appropriate: allergies, current medications, past family history, past medical history, past social history, past surgical history and problem list. Problem list updated.  Objective:   Vitals:   08/01/19 0957  BP: (!) 145/81  Pulse: 98  Weight: 209 lb 3.2 oz (94.9 kg)    Fetal Status: Fetal Heart Rate (bpm): 159   Movement: Present     General:  Alert, oriented and cooperative. Patient is in no acute distress.  Skin: Skin is warm and dry. No rash noted.   Cardiovascular: Normal heart rate noted  Respiratory: Normal respiratory effort, no problems with respiration noted  Abdomen: Soft, gravid, appropriate for gestational age. Pain/Pressure: Absent     Pelvic:  Cervical exam deferred        Extremities: Normal range of motion.  Edema: None  Mental Status: Normal mood and affect. Normal behavior. Normal judgment and thought content.   Urinalysis:      Assessment and Plan:  Pregnancy: G3P1011 at [redacted]w[redacted]d  1. Supervision of high risk pregnancy, antepartum Stable Check UC today Glucola next week 2. Chronic hypertension during pregnancy, antepartum BP stable No S/Sx of PEC Continue to monitor at home and qd BASA Follow up growth scan ordered  3. Hypothyroidism during pregnancy in second trimester TSH  today Continue with Synthyroid  4. History of pre-eclampsia No S/Sx   5. Biological false positive RPR test Will repeat with glucola  6. Maternal atypical antibody affecting pregnancy in second trimester, single or unspecified fetus ABS with Glucola  Preterm labor symptoms and general obstetric precautions including but not limited to vaginal bleeding, contractions, leaking of fluid and fetal movement were reviewed in detail with the patient. Please refer to After Visit Summary for other counseling recommendations.  Return in about 4 weeks (around 08/29/2019) for OB visit, face to face for glucola.   Chancy Milroy, MD

## 2019-08-01 NOTE — Patient Instructions (Signed)

## 2019-08-02 LAB — TSH: TSH: 0.109 u[IU]/mL — ABNORMAL LOW (ref 0.450–4.500)

## 2019-08-04 LAB — URINE CULTURE, OB REFLEX

## 2019-08-04 LAB — CULTURE, OB URINE

## 2019-08-07 ENCOUNTER — Encounter (HOSPITAL_COMMUNITY): Payer: Self-pay

## 2019-08-07 ENCOUNTER — Other Ambulatory Visit: Payer: Self-pay

## 2019-08-07 ENCOUNTER — Telehealth: Payer: Self-pay | Admitting: *Deleted

## 2019-08-07 ENCOUNTER — Other Ambulatory Visit (HOSPITAL_COMMUNITY): Payer: Self-pay | Admitting: *Deleted

## 2019-08-07 ENCOUNTER — Ambulatory Visit (HOSPITAL_COMMUNITY): Payer: Medicaid Other | Admitting: *Deleted

## 2019-08-07 ENCOUNTER — Ambulatory Visit (HOSPITAL_COMMUNITY)
Admission: RE | Admit: 2019-08-07 | Discharge: 2019-08-07 | Disposition: A | Discharge status: Home / Self Care | Payer: Medicaid Other | Source: Ambulatory Visit | Attending: Obstetrics | Admitting: Obstetrics

## 2019-08-07 VITALS — BP 129/81 | HR 100 | Temp 99.0°F

## 2019-08-07 DIAGNOSIS — O99212 Obesity complicating pregnancy, second trimester: Secondary | ICD-10-CM | POA: Diagnosis not present

## 2019-08-07 DIAGNOSIS — O10919 Unspecified pre-existing hypertension complicating pregnancy, unspecified trimester: Secondary | ICD-10-CM | POA: Insufficient documentation

## 2019-08-07 DIAGNOSIS — O10012 Pre-existing essential hypertension complicating pregnancy, second trimester: Secondary | ICD-10-CM

## 2019-08-07 DIAGNOSIS — O0990 Supervision of high risk pregnancy, unspecified, unspecified trimester: Secondary | ICD-10-CM | POA: Diagnosis not present

## 2019-08-07 DIAGNOSIS — Z362 Encounter for other antenatal screening follow-up: Secondary | ICD-10-CM | POA: Diagnosis not present

## 2019-08-07 DIAGNOSIS — Z3A23 23 weeks gestation of pregnancy: Secondary | ICD-10-CM | POA: Diagnosis not present

## 2019-08-07 DIAGNOSIS — O99282 Endocrine, nutritional and metabolic diseases complicating pregnancy, second trimester: Secondary | ICD-10-CM

## 2019-08-07 DIAGNOSIS — E039 Hypothyroidism, unspecified: Secondary | ICD-10-CM | POA: Diagnosis not present

## 2019-08-07 MED ORDER — LEVOTHYROXINE SODIUM 125 MCG PO TABS: 125.0000 ug | ORAL_TABLET | Freq: Every day | ORAL | 1 refills | Status: DC

## 2019-08-07 NOTE — Telephone Encounter (Signed)
-----   Message from Chancy Milroy, MD sent at 08/07/2019  8:30 AM EST ----- Rx for Synthroid 125 mcg po qd Repeat TSH in 6 weeks after starting new dosage  Thanks Legrand Como

## 2019-08-28 ENCOUNTER — Other Ambulatory Visit: Payer: Self-pay | Admitting: *Deleted

## 2019-08-28 DIAGNOSIS — O0990 Supervision of high risk pregnancy, unspecified, unspecified trimester: Secondary | ICD-10-CM

## 2019-08-30 ENCOUNTER — Other Ambulatory Visit: Payer: Medicaid Other

## 2019-08-30 ENCOUNTER — Other Ambulatory Visit: Payer: Self-pay

## 2019-08-30 ENCOUNTER — Ambulatory Visit (INDEPENDENT_AMBULATORY_CARE_PROVIDER_SITE_OTHER): Payer: Medicaid Other | Admitting: Obstetrics and Gynecology

## 2019-08-30 VITALS — BP 124/78 | HR 90 | Wt 210.1 lb

## 2019-08-30 DIAGNOSIS — O10912 Unspecified pre-existing hypertension complicating pregnancy, second trimester: Secondary | ICD-10-CM

## 2019-08-30 DIAGNOSIS — Z3A26 26 weeks gestation of pregnancy: Secondary | ICD-10-CM

## 2019-08-30 DIAGNOSIS — O361920 Maternal care for other isoimmunization, second trimester, not applicable or unspecified: Secondary | ICD-10-CM

## 2019-08-30 DIAGNOSIS — E039 Hypothyroidism, unspecified: Secondary | ICD-10-CM | POA: Diagnosis not present

## 2019-08-30 DIAGNOSIS — O2342 Unspecified infection of urinary tract in pregnancy, second trimester: Secondary | ICD-10-CM

## 2019-08-30 DIAGNOSIS — Z8759 Personal history of other complications of pregnancy, childbirth and the puerperium: Secondary | ICD-10-CM

## 2019-08-30 DIAGNOSIS — O99212 Obesity complicating pregnancy, second trimester: Secondary | ICD-10-CM | POA: Diagnosis not present

## 2019-08-30 DIAGNOSIS — O0990 Supervision of high risk pregnancy, unspecified, unspecified trimester: Secondary | ICD-10-CM

## 2019-08-30 DIAGNOSIS — R768 Other specified abnormal immunological findings in serum: Secondary | ICD-10-CM | POA: Diagnosis not present

## 2019-08-30 DIAGNOSIS — O0992 Supervision of high risk pregnancy, unspecified, second trimester: Secondary | ICD-10-CM

## 2019-08-30 DIAGNOSIS — O99282 Endocrine, nutritional and metabolic diseases complicating pregnancy, second trimester: Secondary | ICD-10-CM | POA: Diagnosis not present

## 2019-08-30 DIAGNOSIS — O10919 Unspecified pre-existing hypertension complicating pregnancy, unspecified trimester: Secondary | ICD-10-CM

## 2019-08-30 DIAGNOSIS — E669 Obesity, unspecified: Secondary | ICD-10-CM | POA: Diagnosis not present

## 2019-08-30 DIAGNOSIS — Z23 Encounter for immunization: Secondary | ICD-10-CM | POA: Diagnosis not present

## 2019-08-30 DIAGNOSIS — O99210 Obesity complicating pregnancy, unspecified trimester: Secondary | ICD-10-CM

## 2019-08-30 NOTE — Progress Notes (Signed)
Prenatal Visit Note Date: 08/30/2019 Clinic: [facility]  Subjective:  [name] [name] is a 28 y.o. G3P1011 at [redacted]w[redacted]d being seen today for ongoing prenatal care.  She is currently monitored for the following issues for this high-risk pregnancy and has Biological false positive RPR test; Supervision of high risk pregnancy, antepartum; History of pre-eclampsia; Obesity in pregnancy; BMI 30s; Chronic hypertension during pregnancy, antepartum; Hypothyroidism in pregnancy; Maternal atypical antibody; and UTI in pregnancy on their problem list.  Patient reports no complaints.   Contractions: Not present. Vag. Bleeding: None.  Movement: Present. Denies leaking of fluid.   The following portions of the patient's history were reviewed and updated as appropriate: allergies, current medications, past family history, past medical history, past social history, past surgical history and problem list. Problem list updated.  Objective:   Vitals:   08/30/19 0845  BP: 124/78  Pulse: 90  Weight: 210 lb 1.6 oz (95.3 kg)    Fetal Status: Fetal Heart Rate (bpm): 162   Movement: Present     General:  Alert, oriented and cooperative. Patient is in no acute distress.  Skin: Skin is warm and dry. No rash noted.   Cardiovascular: Normal heart rate noted  Respiratory: Normal respiratory effort, no problems with respiration noted  Abdomen: Soft, gravid, appropriate for gestational age. Pain/Pressure: Absent     Pelvic:  Cervical exam deferred        Extremities: Normal range of motion.  Edema: None  Mental Status: Normal mood and affect. Normal behavior. Normal judgment and thought content.   Urinalysis:      Assessment and Plan:  Pregnancy: G3P1011 at [redacted]w[redacted]d  1. Supervision of high risk pregnancy, antepartum Routine care. Ask more about bc nv Prefers in person visits - Tdap vaccine greater than or equal to 7yo IM - Antibody screen - TSH  2. Chronic hypertension during pregnancy,  antepartum Doing well on no meds Repeat growth in one week  3. Hypothyroidism during pregnancy in second trimester On synthroid 125. Recheck levels today - TSH  4. Urinary tract infection in mother during second trimester of pregnancy toc neg  5. History of pre-eclampsia Continue low dose asa  6. Obesity in pregnancy   7. BMI 30s   8. Maternal atypical antibody affecting pregnancy in second trimester, single or unspecified fetus Rpt today - Antibody screen  9. Biological false positive RPR test Rpt today  Preterm labor symptoms and general obstetric precautions including but not limited to vaginal bleeding, contractions, leaking of fluid and fetal movement were reviewed in detail with the patient. Please refer to After Visit Summary for other counseling recommendations.  Return in about 3 weeks (around 09/20/2019) for high risk, in person.   Aletha Halim, MD

## 2019-08-31 LAB — CBC
Hematocrit: 34.7 % (ref 34.0–46.6)
Hemoglobin: 11.5 g/dL (ref 11.1–15.9)
MCH: 29.6 pg (ref 26.6–33.0)
MCHC: 33.1 g/dL (ref 31.5–35.7)
MCV: 89 fL (ref 79–97)
Platelets: 195 10*3/uL (ref 150–450)
RBC: 3.88 x10E6/uL (ref 3.77–5.28)
RDW: 13 % (ref 11.7–15.4)
WBC: 9.9 10*3/uL (ref 3.4–10.8)

## 2019-08-31 LAB — RPR, QUANT+TP ABS (REFLEX)
Rapid Plasma Reagin, Quant: 1:2 {titer} — ABNORMAL HIGH
T Pallidum Abs: NONREACTIVE

## 2019-08-31 LAB — GLUCOSE TOLERANCE, 2 HOURS W/ 1HR
Glucose, 1 hour: 123 mg/dL (ref 65–179)
Glucose, 2 hour: 113 mg/dL (ref 65–152)
Glucose, Fasting: 81 mg/dL (ref 65–91)

## 2019-08-31 LAB — RPR: RPR Ser Ql: REACTIVE — AB

## 2019-08-31 LAB — TSH: TSH: 0.446 u[IU]/mL — ABNORMAL LOW (ref 0.450–4.500)

## 2019-08-31 LAB — AB SCR+ANTIBODY ID: Antibody Screen: POSITIVE — AB

## 2019-08-31 LAB — ANTIBODY SCREEN

## 2019-08-31 LAB — HIV ANTIBODY (ROUTINE TESTING W REFLEX): HIV Screen 4th Generation wRfx: NONREACTIVE

## 2019-09-04 ENCOUNTER — Ambulatory Visit (HOSPITAL_COMMUNITY)
Admission: RE | Admit: 2019-09-04 | Discharge: 2019-09-04 | Disposition: A | Discharge status: Home / Self Care | Payer: Medicaid Other | Source: Ambulatory Visit | Attending: Obstetrics and Gynecology | Admitting: Obstetrics and Gynecology

## 2019-09-04 ENCOUNTER — Other Ambulatory Visit (HOSPITAL_COMMUNITY): Payer: Self-pay | Admitting: *Deleted

## 2019-09-04 ENCOUNTER — Other Ambulatory Visit: Payer: Self-pay

## 2019-09-04 ENCOUNTER — Ambulatory Visit (HOSPITAL_COMMUNITY): Payer: Medicaid Other | Admitting: *Deleted

## 2019-09-04 ENCOUNTER — Encounter (HOSPITAL_COMMUNITY): Payer: Self-pay

## 2019-09-04 DIAGNOSIS — Z8759 Personal history of other complications of pregnancy, childbirth and the puerperium: Secondary | ICD-10-CM | POA: Insufficient documentation

## 2019-09-04 DIAGNOSIS — Z362 Encounter for other antenatal screening follow-up: Secondary | ICD-10-CM | POA: Diagnosis not present

## 2019-09-04 DIAGNOSIS — O99210 Obesity complicating pregnancy, unspecified trimester: Secondary | ICD-10-CM

## 2019-09-04 DIAGNOSIS — O99282 Endocrine, nutritional and metabolic diseases complicating pregnancy, second trimester: Secondary | ICD-10-CM | POA: Diagnosis not present

## 2019-09-04 DIAGNOSIS — Z3A27 27 weeks gestation of pregnancy: Secondary | ICD-10-CM | POA: Diagnosis not present

## 2019-09-04 DIAGNOSIS — O0990 Supervision of high risk pregnancy, unspecified, unspecified trimester: Secondary | ICD-10-CM | POA: Insufficient documentation

## 2019-09-04 DIAGNOSIS — O10012 Pre-existing essential hypertension complicating pregnancy, second trimester: Secondary | ICD-10-CM | POA: Diagnosis not present

## 2019-09-04 DIAGNOSIS — O99212 Obesity complicating pregnancy, second trimester: Secondary | ICD-10-CM | POA: Diagnosis not present

## 2019-09-04 DIAGNOSIS — O10919 Unspecified pre-existing hypertension complicating pregnancy, unspecified trimester: Secondary | ICD-10-CM

## 2019-09-04 DIAGNOSIS — E039 Hypothyroidism, unspecified: Secondary | ICD-10-CM | POA: Diagnosis not present

## 2019-09-14 MED ORDER — LEVOTHYROXINE SODIUM 125 MCG PO TABS: 125.0000 ug | ORAL_TABLET | Freq: Every day | ORAL | 2 refills | Status: DC

## 2019-09-20 ENCOUNTER — Other Ambulatory Visit: Payer: Self-pay

## 2019-09-20 ENCOUNTER — Ambulatory Visit (INDEPENDENT_AMBULATORY_CARE_PROVIDER_SITE_OTHER): Payer: Medicaid Other | Admitting: Family Medicine

## 2019-09-20 VITALS — BP 124/82 | Wt 211.0 lb

## 2019-09-20 DIAGNOSIS — O0990 Supervision of high risk pregnancy, unspecified, unspecified trimester: Secondary | ICD-10-CM

## 2019-09-20 DIAGNOSIS — O10913 Unspecified pre-existing hypertension complicating pregnancy, third trimester: Secondary | ICD-10-CM

## 2019-09-20 DIAGNOSIS — Z3A29 29 weeks gestation of pregnancy: Secondary | ICD-10-CM

## 2019-09-20 DIAGNOSIS — R768 Other specified abnormal immunological findings in serum: Secondary | ICD-10-CM

## 2019-09-20 DIAGNOSIS — O99283 Endocrine, nutritional and metabolic diseases complicating pregnancy, third trimester: Secondary | ICD-10-CM

## 2019-09-20 DIAGNOSIS — E039 Hypothyroidism, unspecified: Secondary | ICD-10-CM

## 2019-09-20 DIAGNOSIS — O361930 Maternal care for other isoimmunization, third trimester, not applicable or unspecified: Secondary | ICD-10-CM

## 2019-09-20 DIAGNOSIS — O10919 Unspecified pre-existing hypertension complicating pregnancy, unspecified trimester: Secondary | ICD-10-CM

## 2019-09-20 DIAGNOSIS — O361920 Maternal care for other isoimmunization, second trimester, not applicable or unspecified: Secondary | ICD-10-CM

## 2019-09-20 DIAGNOSIS — O0993 Supervision of high risk pregnancy, unspecified, third trimester: Secondary | ICD-10-CM

## 2019-09-20 NOTE — Patient Instructions (Signed)

## 2019-09-20 NOTE — Progress Notes (Signed)
   PRENATAL VISIT NOTE  Subjective:  [name] [name] is a 28 y.o. G3P1011 at [redacted]w[redacted]d being seen today for ongoing prenatal care.  She is currently monitored for the following issues for this low-risk pregnancy and has Biological false positive RPR test; Supervision of high risk pregnancy, antepartum; History of pre-eclampsia; Obesity in pregnancy; BMI 30s; Chronic hypertension during pregnancy, antepartum; Hypothyroidism in pregnancy; Maternal atypical antibody; and UTI in pregnancy on their problem list.  Patient reports no complaints.  Contractions: Not present. Vag. Bleeding: None.  Movement: Present. Denies leaking of fluid.   The following portions of the patient's history were reviewed and updated as appropriate: allergies, current medications, past family history, past medical history, past social history, past surgical history and problem list.   Objective:   Vitals:   09/20/19 0959  BP: 124/82  Weight: 211 lb (95.7 kg)    Fetal Status: Fetal Heart Rate (bpm): 160 Fundal Height: 29 cm Movement: Present     General:  Alert, oriented and cooperative. Patient is in no acute distress.  Skin: Skin is warm and dry. No rash noted.   Cardiovascular: Normal heart rate noted  Respiratory: Normal respiratory effort, no problems with respiration noted  Abdomen: Soft, gravid, appropriate for gestational age.  Pain/Pressure: Absent     Pelvic: Cervical exam deferred        Extremities: Normal range of motion.  Edema: None  Mental Status: Normal mood and affect. Normal behavior. Normal judgment and thought content.   Assessment and Plan:  Pregnancy: G3P1011 at [redacted]w[redacted]d 1. Supervision of high risk pregnancy, antepartum Continue prenatal care.  2. Chronic hypertension during pregnancy, antepartum BP is well controlled, on no meds. U/S for growth,  At 74%12/7 Continue ASA  3. Hypothyroidism during pregnancy in third trimester TSH ok last check  4. Maternal atypical antibody affecting  pregnancy in second trimester, single or unspecified fetus Unidentified, stable  5. Biological false positive RPR test     Preterm labor symptoms and general obstetric precautions including but not limited to vaginal bleeding, contractions, leaking of fluid and fetal movement were reviewed in detail with the patient. Please refer to After Visit Summary for other counseling recommendations.   Return in 2 weeks (on 10/04/2019) for virtual, [place].  Future Appointments  Date Time Provider [place]  10/02/2019  1:45 PM [place] NURSE [place] [facility]  10/02/2019  1:45 PM [place] 2 [facility]  10/04/2019  4:15 PM Sloan Leiter, MD [facility]    Donnamae Jude, MD

## 2019-09-25 ENCOUNTER — Other Ambulatory Visit: Payer: Self-pay | Admitting: Obstetrics and Gynecology

## 2019-09-29 NOTE — L&D Delivery Note (Signed)
OB/GYN Faculty Practice Delivery Note  [name] [name] is a 29 y.o. G3P1011 s/p VD at [redacted]w[redacted]d. She was admitted for IOL for severe Pre-E.   ROM: 10h 60m with clear fluid GBS Status: unknown   Maximum Maternal Temperature: 98.4  Labor Progress: . Initial SVE: closed/thick/high. Patient received Cytotec, FB, Pitocin and AROM. Received epidural. She then progressed to complete.   Delivery Date/Time: 2/12 @ 2046 Delivery: Called to room and patient was complete and pushing. Head delivered in direct OP position. Loose nuchal cord present and reduced. Shoulder and body delivered in usual fashion. Infant with spontaneous cry, placed on mother's abdomen, dried and stimulated. Cord clamped x 2 after 1-minute delay, and cut by FOB. Cord blood drawn. Placenta delivered spontaneously with gentle cord traction. Fundus firm with massage and Pitocin. Labia, perineum, vagina, and cervix inspected inspected with type 3b perineal laceration which was repaired with 2-0 and 3-0 Vicryl in a standard fashion.   Baby Weight: pending  Placenta: Sent to L&D Complications: None Lacerations: 3b perineal laceration EBL: 657 mL Analgesia: Epidural and local lidocaine    Infant:  APGAR (1 MIN): 8   APGAR (5 MINS): 9   APGAR (10 MINS):     [name], MD [facility] Family Medicine Fellow, [facility] for [facility], [facility] 11/10/2019, 9:34 PM

## 2019-10-02 ENCOUNTER — Encounter (HOSPITAL_COMMUNITY): Payer: Self-pay

## 2019-10-02 ENCOUNTER — Ambulatory Visit (HOSPITAL_COMMUNITY): Payer: Medicaid Other | Admitting: *Deleted

## 2019-10-02 ENCOUNTER — Other Ambulatory Visit: Payer: Self-pay

## 2019-10-02 ENCOUNTER — Ambulatory Visit (HOSPITAL_COMMUNITY)
Admission: RE | Admit: 2019-10-02 | Discharge: 2019-10-02 | Disposition: A | Discharge status: Home / Self Care | Payer: Medicaid Other | Source: Ambulatory Visit | Attending: Obstetrics and Gynecology | Admitting: Obstetrics and Gynecology

## 2019-10-02 ENCOUNTER — Other Ambulatory Visit (HOSPITAL_COMMUNITY): Payer: Self-pay | Admitting: *Deleted

## 2019-10-02 DIAGNOSIS — Z362 Encounter for other antenatal screening follow-up: Secondary | ICD-10-CM

## 2019-10-02 DIAGNOSIS — O99283 Endocrine, nutritional and metabolic diseases complicating pregnancy, third trimester: Secondary | ICD-10-CM | POA: Diagnosis not present

## 2019-10-02 DIAGNOSIS — Z3A31 31 weeks gestation of pregnancy: Secondary | ICD-10-CM | POA: Diagnosis not present

## 2019-10-02 DIAGNOSIS — Z8759 Personal history of other complications of pregnancy, childbirth and the puerperium: Secondary | ICD-10-CM | POA: Insufficient documentation

## 2019-10-02 DIAGNOSIS — O0990 Supervision of high risk pregnancy, unspecified, unspecified trimester: Secondary | ICD-10-CM | POA: Insufficient documentation

## 2019-10-02 DIAGNOSIS — E039 Hypothyroidism, unspecified: Secondary | ICD-10-CM

## 2019-10-02 DIAGNOSIS — O09293 Supervision of pregnancy with other poor reproductive or obstetric history, third trimester: Secondary | ICD-10-CM | POA: Diagnosis not present

## 2019-10-02 DIAGNOSIS — O99210 Obesity complicating pregnancy, unspecified trimester: Secondary | ICD-10-CM | POA: Diagnosis not present

## 2019-10-02 DIAGNOSIS — O10919 Unspecified pre-existing hypertension complicating pregnancy, unspecified trimester: Secondary | ICD-10-CM

## 2019-10-02 DIAGNOSIS — O10013 Pre-existing essential hypertension complicating pregnancy, third trimester: Secondary | ICD-10-CM | POA: Diagnosis not present

## 2019-10-02 DIAGNOSIS — O99213 Obesity complicating pregnancy, third trimester: Secondary | ICD-10-CM

## 2019-10-04 ENCOUNTER — Encounter: Payer: Self-pay | Admitting: Obstetrics and Gynecology

## 2019-10-04 ENCOUNTER — Telehealth (INDEPENDENT_AMBULATORY_CARE_PROVIDER_SITE_OTHER): Payer: Medicaid Other | Admitting: Obstetrics and Gynecology

## 2019-10-04 VITALS — BP 149/87 | HR 90

## 2019-10-04 DIAGNOSIS — O0993 Supervision of high risk pregnancy, unspecified, third trimester: Secondary | ICD-10-CM

## 2019-10-04 DIAGNOSIS — O99283 Endocrine, nutritional and metabolic diseases complicating pregnancy, third trimester: Secondary | ICD-10-CM

## 2019-10-04 DIAGNOSIS — O361930 Maternal care for other isoimmunization, third trimester, not applicable or unspecified: Secondary | ICD-10-CM

## 2019-10-04 DIAGNOSIS — E039 Hypothyroidism, unspecified: Secondary | ICD-10-CM

## 2019-10-04 DIAGNOSIS — O10919 Unspecified pre-existing hypertension complicating pregnancy, unspecified trimester: Secondary | ICD-10-CM

## 2019-10-04 DIAGNOSIS — O10913 Unspecified pre-existing hypertension complicating pregnancy, third trimester: Secondary | ICD-10-CM

## 2019-10-04 DIAGNOSIS — O0990 Supervision of high risk pregnancy, unspecified, unspecified trimester: Secondary | ICD-10-CM

## 2019-10-04 DIAGNOSIS — Z8759 Personal history of other complications of pregnancy, childbirth and the puerperium: Secondary | ICD-10-CM

## 2019-10-04 NOTE — Progress Notes (Signed)
Patient reports daily headaches that go away by lunch time. Reports elevated blood pressures at U/S appt on Monday.

## 2019-10-04 NOTE — Progress Notes (Signed)
   TELEHEALTH OBSTETRICS PRENATAL VIRTUAL VIDEO VISIT ENCOUNTER NOTE  Provider location: [facility] for [facility] [place]   I connected with [name] [name] on 10/04/19 at  4:15 PM EST by MyChart Video Encounter at home and verified that I am speaking with the correct person using two identifiers.   I discussed the limitations, risks, security and privacy concerns of performing an evaluation and management service virtually and the availability of in person appointments. I also discussed with the patient that there may be a patient responsible charge related to this service. The patient expressed understanding and agreed to proceed. Subjective:  [name] [name] is a 29 y.o. G3P1011 at [redacted]w[redacted]d being seen today for ongoing prenatal care.  She is currently monitored for the following issues for this high-risk pregnancy and has Biological false positive RPR test; Supervision of high risk pregnancy, antepartum; History of pre-eclampsia; Obesity in pregnancy; BMI 30s; Chronic hypertension during pregnancy, antepartum; Hypothyroidism in pregnancy; Maternal atypical antibody; and UTI in pregnancy on their problem list.  Patient reports no complaints.  Contractions: Not present. Vag. Bleeding: None.  Movement: Present. Denies any leaking of fluid.   The following portions of the patient's history were reviewed and updated as appropriate: allergies, current medications, past family history, past medical history, past social history, past surgical history and problem list.   Objective:   Vitals:   10/04/19 1618  BP: (!) 149/87  Pulse: 90   Fetal Status:     Movement: Present     General:  Alert, oriented and cooperative. Patient is in no acute distress.  Respiratory: Normal respiratory effort, no problems with respiration noted  Mental Status: Normal mood and affect. Normal behavior. Normal judgment and thought content.  Rest of physical exam deferred due to type of encounter   Assessment and  Plan:  Pregnancy: G3P1011 at [redacted]w[redacted]d  1. Supervision of high risk pregnancy, antepartum  2. Chronic hypertension during pregnancy, antepartum - Cont baby ASA - Pt with mild range BP today, asymptomatic - instructed her to start taking BP twice weekly and put into baby scripts, call office with any BP >160/100 as she may need to start medication - has BPP scheduled for 10/16/19, will need weekly testing - reviewed s/s and reasons to go to [facility]U, she verbalizes understanding  3. History of pre-eclampsia  4. Maternal atypical antibody affecting pregnancy in third trimester, single or unspecified fetus Needs T&S on admission  5. Hypothyroidism during pregnancy in third trimester Last TSH appropriate Cont synthroid  Preterm labor symptoms and general obstetric precautions including but not limited to vaginal bleeding, contractions, leaking of fluid and fetal movement were reviewed in detail with the patient. I discussed the assessment and treatment plan with the patient. The patient was provided an opportunity to ask questions and all were answered. The patient agreed with the plan and demonstrated an understanding of the instructions. The patient was advised to call back or seek an in-person office evaluation/go to [facility] at [facility] for any urgent or concerning symptoms. Please refer to After Visit Summary for other counseling recommendations.   I provided 15 minutes of face-to-face time during this encounter.  Return in about 2 weeks (around 10/18/2019) for high OB, in person.  Future Appointments  Date Time Provider Department Center  10/16/2019 12:40 PM [facility] NURSE [facility]  10/16/2019 12:45 PM [facility] [place] 2 [facility]    Conan Bowens, MD [facility] for [facility], [facility]

## 2019-10-04 NOTE — Progress Notes (Signed)
I connected with  [name] [name] on 10/04/19 at  4:15 PM EST by mychart video and verified that I am speaking with the correct person using two identifiers.   I discussed the limitations, risks, security and privacy concerns of performing an evaluation and management service by telephone and the availability of in person appointments. I also discussed with the patient that there may be a patient responsible charge related to this service. The patient expressed understanding and agreed to proceed.  Marylynn Pearson, RN 10/04/2019  4:16 PM

## 2019-10-16 ENCOUNTER — Other Ambulatory Visit: Payer: Self-pay

## 2019-10-16 ENCOUNTER — Ambulatory Visit (HOSPITAL_COMMUNITY): Payer: Medicaid Other | Admitting: *Deleted

## 2019-10-16 ENCOUNTER — Encounter (HOSPITAL_COMMUNITY): Payer: Self-pay | Admitting: Obstetrics and Gynecology

## 2019-10-16 ENCOUNTER — Inpatient Hospital Stay (HOSPITAL_COMMUNITY)
Admission: AD | Admit: 2019-10-16 | Discharge: 2019-10-16 | Disposition: A | Discharge status: Home / Self Care | Payer: Medicaid Other | Attending: Obstetrics and Gynecology | Admitting: Obstetrics and Gynecology

## 2019-10-16 ENCOUNTER — Other Ambulatory Visit (HOSPITAL_COMMUNITY): Payer: Self-pay | Admitting: *Deleted

## 2019-10-16 ENCOUNTER — Encounter (HOSPITAL_COMMUNITY): Payer: Self-pay | Admitting: *Deleted

## 2019-10-16 ENCOUNTER — Inpatient Hospital Stay (HOSPITAL_COMMUNITY): Payer: Medicaid Other

## 2019-10-16 ENCOUNTER — Ambulatory Visit (HOSPITAL_COMMUNITY)
Admission: RE | Admit: 2019-10-16 | Discharge: 2019-10-16 | Disposition: A | Discharge status: Home / Self Care | Payer: Medicaid Other | Source: Ambulatory Visit | Attending: Obstetrics and Gynecology | Admitting: Obstetrics and Gynecology

## 2019-10-16 DIAGNOSIS — R1011 Right upper quadrant pain: Secondary | ICD-10-CM | POA: Diagnosis not present

## 2019-10-16 DIAGNOSIS — O10013 Pre-existing essential hypertension complicating pregnancy, third trimester: Secondary | ICD-10-CM

## 2019-10-16 DIAGNOSIS — O09299 Supervision of pregnancy with other poor reproductive or obstetric history, unspecified trimester: Secondary | ICD-10-CM

## 2019-10-16 DIAGNOSIS — Z8249 Family history of ischemic heart disease and other diseases of the circulatory system: Secondary | ICD-10-CM | POA: Diagnosis not present

## 2019-10-16 DIAGNOSIS — Z79890 Hormone replacement therapy (postmenopausal): Secondary | ICD-10-CM | POA: Diagnosis not present

## 2019-10-16 DIAGNOSIS — Z7982 Long term (current) use of aspirin: Secondary | ICD-10-CM | POA: Insufficient documentation

## 2019-10-16 DIAGNOSIS — Z8759 Personal history of other complications of pregnancy, childbirth and the puerperium: Secondary | ICD-10-CM

## 2019-10-16 DIAGNOSIS — O99283 Endocrine, nutritional and metabolic diseases complicating pregnancy, third trimester: Secondary | ICD-10-CM

## 2019-10-16 DIAGNOSIS — O10913 Unspecified pre-existing hypertension complicating pregnancy, third trimester: Secondary | ICD-10-CM | POA: Diagnosis not present

## 2019-10-16 DIAGNOSIS — O10919 Unspecified pre-existing hypertension complicating pregnancy, unspecified trimester: Secondary | ICD-10-CM | POA: Insufficient documentation

## 2019-10-16 DIAGNOSIS — I10 Essential (primary) hypertension: Secondary | ICD-10-CM | POA: Diagnosis present

## 2019-10-16 DIAGNOSIS — Z3689 Encounter for other specified antenatal screening: Secondary | ICD-10-CM | POA: Insufficient documentation

## 2019-10-16 DIAGNOSIS — O99210 Obesity complicating pregnancy, unspecified trimester: Secondary | ICD-10-CM | POA: Diagnosis present

## 2019-10-16 DIAGNOSIS — E039 Hypothyroidism, unspecified: Secondary | ICD-10-CM

## 2019-10-16 DIAGNOSIS — O26893 Other specified pregnancy related conditions, third trimester: Secondary | ICD-10-CM | POA: Insufficient documentation

## 2019-10-16 DIAGNOSIS — O09893 Supervision of other high risk pregnancies, third trimester: Secondary | ICD-10-CM | POA: Diagnosis not present

## 2019-10-16 DIAGNOSIS — O99213 Obesity complicating pregnancy, third trimester: Secondary | ICD-10-CM | POA: Diagnosis not present

## 2019-10-16 DIAGNOSIS — Z3A33 33 weeks gestation of pregnancy: Secondary | ICD-10-CM

## 2019-10-16 DIAGNOSIS — O0990 Supervision of high risk pregnancy, unspecified, unspecified trimester: Secondary | ICD-10-CM | POA: Insufficient documentation

## 2019-10-16 LAB — URINALYSIS, ROUTINE W REFLEX MICROSCOPIC
Bilirubin Urine: NEGATIVE
Glucose, UA: NEGATIVE mg/dL
Hgb urine dipstick: NEGATIVE
Ketones, ur: NEGATIVE mg/dL
Leukocytes,Ua: NEGATIVE
Nitrite: NEGATIVE
Protein, ur: NEGATIVE mg/dL
Specific Gravity, Urine: 1.009 (ref 1.005–1.030)
pH: 7 (ref 5.0–8.0)

## 2019-10-16 LAB — CBC
HCT: 34.3 % — ABNORMAL LOW (ref 36.0–46.0)
Hemoglobin: 11.8 g/dL — ABNORMAL LOW (ref 12.0–15.0)
MCH: 30 pg (ref 26.0–34.0)
MCHC: 34.4 g/dL (ref 30.0–36.0)
MCV: 87.3 fL (ref 80.0–100.0)
Platelets: 171 10*3/uL (ref 150–400)
RBC: 3.93 MIL/uL (ref 3.87–5.11)
RDW: 13.7 % (ref 11.5–15.5)
WBC: 8.7 10*3/uL (ref 4.0–10.5)
nRBC: 0 % (ref 0.0–0.2)

## 2019-10-16 LAB — COMPREHENSIVE METABOLIC PANEL
ALT: 9 U/L (ref 0–44)
AST: 13 U/L — ABNORMAL LOW (ref 15–41)
Albumin: 3.4 g/dL — ABNORMAL LOW (ref 3.5–5.0)
Alkaline Phosphatase: 44 U/L (ref 38–126)
Anion gap: 11 (ref 5–15)
BUN: <5 mg/dL — ABNORMAL LOW (ref 6–20)
CO2: 20 mmol/L — ABNORMAL LOW (ref 22–32)
Calcium: 9.6 mg/dL (ref 8.9–10.3)
Chloride: 105 mmol/L (ref 98–111)
Creatinine, Ser: 0.47 mg/dL (ref 0.44–1.00)
GFR calc Af Amer: >60 mL/min (ref >60)
GFR calc non Af Amer: >60 mL/min (ref >60)
Glucose, Bld: 96 mg/dL (ref 70–99)
Potassium: 3.5 mmol/L (ref 3.5–5.1)
Sodium: 136 mmol/L (ref 135–145)
Total Bilirubin: 0.9 mg/dL (ref 0.3–1.2)
Total Protein: 6.6 g/dL (ref 6.5–8.1)

## 2019-10-16 LAB — PROTEIN / CREATININE RATIO, URINE
Creatinine, Urine: 51.06 mg/dL
Protein Creatinine Ratio: 0.12 mg/mg{Cre} (ref 0.00–0.15)
Total Protein, Urine: 6 mg/dL

## 2019-10-16 MED ORDER — LABETALOL HCL 200 MG PO TABS: 200.0000 mg | ORAL_TABLET | Freq: Two times a day (BID) | ORAL | 2 refills | Status: DC

## 2019-10-16 NOTE — MAU Provider Note (Signed)
History     CSN: [id number]  Arrival date and time: 10/16/19 1807   First Provider Initiated Contact with Patient 10/16/19 1849      Chief Complaint  Patient presents with  . Hypertension   Ms. [name] [name] is a 29 y.o. G3P1011 at 67w2dwho presents to MAU for preeclampsia evaluation after she was seen at [facility]FM today for a BPP and was found to have severe range blood pressures. Pt reports BP at home is normally 140s/90s, but does report some severe range pressure occasionally at home of 160s/90s.  Pt reports HA in AM upon waking and before going to sleep, but denies HA at this time. Pt also reports RUQ pain daily around 0930PM, but reports the pain is not present at this time.  Pt denies HA, blurry vision/seeing spots, N/V, epigastric pain, swelling in face and hands, sudden weight gain. Pt denies chest pain and SOB.  Pt denies constipation, diarrhea, or urinary problems. Pt denies fever, chills, fatigue, sweating or changes in appetite. Pt denies dizziness, light-headedness, weakness.  Pt denies VB, ctx, LOF and reports good FM.  Current pregnancy problems? Hx of preeclampsia, cHTN per OB (pt reports normal BPs outside of pregnancy 129/84), hypothyroid, obestiy Blood Type? O Positive Allergies? NKDA Current medications? BASA, synthroid, PNVs Current PNC & next appt? [facility], 10/19/2019 (virtual)   OB History    Gravida  3   Para  1   Term  1   Preterm  0   AB  1   Living  1     SAB  1   TAB  0   Ectopic  0   Multiple  0   Live Births  1           Past Medical History:  Diagnosis Date  . Hypothyroidism   . Pregnancy induced hypertension     Past Surgical History:  Procedure Laterality Date  . penny removed from throat     as a child. no scar noted.    Family History  Problem Relation Age of Onset  . Hypertension Mother   . Heart disease Mother   . Hypertension Father     Social History   Tobacco Use  . Smoking status: Never Smoker  .  Smokeless tobacco: Never Used  Substance Use Topics  . Alcohol use: No  . Drug use: No    Allergies: No Known Allergies  Medications Prior to Admission  Medication Sig Dispense Refill Last Dose  . aspirin EC 81 MG tablet Take 1 tablet (81 mg total) by mouth daily. 30 tablet 10   . Blood Pressure Monitoring (BLOOD PRESSURE KIT) DEVI 1 Device by Does not apply route as needed. ICD 10:   O09.90 1 Device 0   . levothyroxine (SYNTHROID) 125 MCG tablet TAKE 1 TABLET(125 MCG) BY MOUTH DAILY BEFORE BREAKFAST 30 tablet 2   . Prenatal Vit-Fe Fumarate-FA (MULTIVITAMIN-PRENATAL) 27-0.8 MG TABS tablet Take 1 tablet by mouth daily at 12 noon.       Review of Systems  Constitutional: Negative for chills, diaphoresis, fatigue and fever.  Eyes: Negative for visual disturbance.  Respiratory: Negative for shortness of breath.   Cardiovascular: Negative for chest pain.  Gastrointestinal: Negative for abdominal pain, constipation, diarrhea, nausea and vomiting.  Genitourinary: Negative for dysuria, flank pain, frequency, pelvic pain, urgency, vaginal bleeding and vaginal discharge.  Neurological: Negative for dizziness, weakness, light-headedness and headaches.   Physical Exam   Blood pressure 128/79, pulse 80, temperature 98.3 F (  36.8 C), temperature source Oral, resp. rate 16, height 5' 5.5" (1.664 m), weight 98.6 kg, last menstrual period 09/15/2018, SpO2 99 %, unknown if currently breastfeeding.  Patient Vitals for the past 24 hrs:  BP Temp Temp src Pulse Resp SpO2 Height Weight  10/16/19 2021 128/79 -- -- 80 -- -- -- --  10/16/19 1916 137/77 -- -- 92 -- -- -- --  10/16/19 1901 135/84 -- -- 94 -- -- -- --  10/16/19 1846 131/62 -- -- 97 -- -- -- --  10/16/19 1842 (!) 155/84 -- -- 83 -- -- -- --  10/16/19 1822 (!) 156/95 98.3 F (36.8 C) Oral 91 16 99 % -- --  10/16/19 1819 -- -- -- -- -- -- 5' 5.5" (1.664 m) 98.6 kg   Physical Exam  Constitutional: She is oriented to person, place, and time.  She appears well-developed and well-nourished. No distress.  HENT:  Head: Normocephalic and atraumatic.  Respiratory: Effort normal.  GI: Soft. She exhibits no distension and no mass. There is no abdominal tenderness. There is no rebound and no guarding.  Neurological: She is alert and oriented to person, place, and time.  Skin: Skin is warm and dry. She is not diaphoretic.  Psychiatric: She has a normal mood and affect. Her behavior is normal. Judgment and thought content normal.   Results for orders placed or performed during the hospital encounter of 10/16/19 (from the past 24 hour(s))  Urinalysis, Routine w reflex microscopic     Status: Abnormal   Collection Time: 10/16/19  6:42 PM  Result Value Ref Range   Color, Urine YELLOW YELLOW   APPearance HAZY (A) CLEAR   Specific Gravity, Urine 1.009 1.005 - 1.030   pH 7.0 5.0 - 8.0   Glucose, UA NEGATIVE NEGATIVE mg/dL   Hgb urine dipstick NEGATIVE NEGATIVE   Bilirubin Urine NEGATIVE NEGATIVE   Ketones, ur NEGATIVE NEGATIVE mg/dL   Protein, ur NEGATIVE NEGATIVE mg/dL   Nitrite NEGATIVE NEGATIVE   Leukocytes,Ua NEGATIVE NEGATIVE  Protein / creatinine ratio, urine     Status: None   Collection Time: 10/16/19  6:45 PM  Result Value Ref Range   Creatinine, Urine 51.06 mg/dL   Total Protein, Urine 6 mg/dL   Protein Creatinine Ratio 0.12 0.00 - 0.15 mg/mg[Cre]  CBC     Status: Abnormal   Collection Time: 10/16/19  6:48 PM  Result Value Ref Range   WBC 8.7 4.0 - 10.5 K/uL   RBC 3.93 3.87 - 5.11 MIL/uL   Hemoglobin 11.8 (L) 12.0 - 15.0 g/dL   HCT 34.3 (L) 36.0 - 46.0 %   MCV 87.3 80.0 - 100.0 fL   MCH 30.0 26.0 - 34.0 pg   MCHC 34.4 30.0 - 36.0 g/dL   RDW 13.7 11.5 - 15.5 %   Platelets 171 150 - 400 K/uL   nRBC 0.0 0.0 - 0.2 %  Comprehensive metabolic panel     Status: Abnormal   Collection Time: 10/16/19  6:48 PM  Result Value Ref Range   Sodium 136 135 - 145 mmol/L   Potassium 3.5 3.5 - 5.1 mmol/L   Chloride 105 98 - 111  mmol/L   CO2 20 (L) 22 - 32 mmol/L   Glucose, Bld 96 70 - 99 mg/dL   BUN <5 (L) 6 - 20 mg/dL   Creatinine, Ser 0.47 0.44 - 1.00 mg/dL   Calcium 9.6 8.9 - 10.3 mg/dL   Total Protein 6.6 6.5 - 8.1 g/dL   Albumin 3.4 (  L) 3.5 - 5.0 g/dL   AST 13 (L) 15 - 41 U/L   ALT 9 0 - 44 U/L   Alkaline Phosphatase 44 38 - 126 U/L   Total Bilirubin 0.9 0.3 - 1.2 mg/dL   GFR calc non Af Amer >60 >60 mL/min   GFR calc Af Amer >60 >60 mL/min   Anion gap 11 5 - 15   [place] MFM FETAL BPP WO NON STRESS  Result Date: 10/16/2019 ----------------------------------------------------------------------  OBSTETRICS REPORT                       (Signed Final 10/16/2019 01:56 pm) ---------------------------------------------------------------------- Patient Info  ID #:       [id number]                          D.O.B.:  08-03-1991 (28 yrs)  Name:       [name] [name]                 Visit Date: 10/16/2019 12:57 pm ---------------------------------------------------------------------- Performed By  Performed By:     Novella Rob        Ref. Address:     [address]                                                             [address]  Attending:        Johnell Comings MD         [facility]  Referred By:      [facility] ---------------------------------------------------------------------- Orders   #  Description                          Code         Ordered By   1  [place] MFM FETAL BPP WO NON              76819.01     [facility]      STRESS  ----------------------------------------------------------------------   #  Order #                    Accession #                 Episode #   1  [id number]                  [id number]                  [id number]  ---------------------------------------------------------------------- Indications   Hypertension - Chronic/Pre-existing (ASA) -    O10.019   poor control   Encounter for  other antenatal screening  Z36.2   follow-up   [redacted] weeks gestation of pregnancy                P6P.95   Obesity complicating pregnancy, second         O99.212   trimester (prepreg BMI 32.3)   Hypothyroid (Synthroid)                        O99.280 E03.9   Low Risk NIPS, neg Horizon   Poor obstetric history: Previous               O09.299   preeclampsia / eclampsia/gestational HTN   (IOL for precclampsa @ 40w, 1st pregnancy)  ---------------------------------------------------------------------- Vital Signs  Weight (lb): 211                               Height:        5'6"  BMI:         34.05 ---------------------------------------------------------------------- Fetal Evaluation  Num Of Fetuses:         1  Fetal Heart Rate(bpm):  158  Cardiac Activity:       Observed  Presentation:           Cephalic  Placenta:               Posterior  P. Cord Insertion:      Previously Visualized  Amniotic Fluid  AFI FV:      Within normal limits  AFI Sum(cm)     %Tile       Largest Pocket(cm)  8.07            5           5.12  RUQ(cm)                                    LLQ(cm)  5.12                                       2.95 ---------------------------------------------------------------------- Biophysical Evaluation  Amniotic F.V:   Within normal limits       F. Tone:        Observed  F. Movement:    Observed                   Score:          8/8  F. Breathing:   Observed ---------------------------------------------------------------------- OB History  Gravidity:    3         Term:   1        Prem:   0        SAB:   1  TOP:          0       Ectopic:  0        Living: 1 ---------------------------------------------------------------------- Gestational Age  Best:          33w 2d     Det. ByLoman Chroman         EDD:   12/02/19                                      (  04/25/19) ---------------------------------------------------------------------- Anatomy  Thoracic:              Appears normal         Bladder:                 Appears normal  Stomach:               Appears normal, left                         sided ---------------------------------------------------------------------- Cervix Uterus Adnexa  Cervix  Not visualized (advanced GA >24wks) ---------------------------------------------------------------------- Comments  This patient was seen for a biophysical profile due to a history  of preeclampsia in her prior pregnancy and chronic  hypertension that is not currently treated with any  medications.  The patient reports that she has been having  headaches on a daily basis over the past week.  The patient's  blood pressures today were noted to be 167/92, 160/94, and  160/84.  She is currently asymptomatic.  A biophysical profile performed today was 8 out of 8.  There was normal amniotic fluid noted on today's ultrasound  exam.  Due to her elevated blood pressures and daily headaches,  the patient was advised regarding my concern regarding  preeclampsia.  She was advised to go to the MAU for  assessment of preeclampsia.  Due to childcare issues, she  will be unable to go to the MAU until later this evening.  Preeclampsia precautions were reviewed today.  Should the patient remain undelivered, we will continue to  see her for weekly fetal testing.  A biophysical profile was scheduled in 1 week. ----------------------------------------------------------------------                   Johnell Comings, MD Electronically Signed Final Report   10/16/2019 01:56 pm ----------------------------------------------------------------------  [place] MFM OB FOLLOW UP  Result Date: 10/02/2019 ----------------------------------------------------------------------  OBSTETRICS REPORT                       (Signed Final 10/02/2019 03:03 pm) ---------------------------------------------------------------------- Patient Info  ID #:       [id number]                          D.O.B.:  1991-02-26 (28 yrs)  Name:       [name] [name]                 Visit Date:  10/02/2019 01:55 pm ---------------------------------------------------------------------- Performed By  Performed By:     Corky Crafts             Ref. Address:     [address]                    [place]                                                             [address]  Attending:        Tama High MD        [facility]  Referred By:      [facility] ---------------------------------------------------------------------- Orders   #  Description                          Code         Ordered By   1  [place] MFM OB FOLLOW UP                  [phone]     YU FANG  ----------------------------------------------------------------------   #  Order #                    Accession #                 Episode #   1  [id number]                  [id number]                  [id number]  ---------------------------------------------------------------------- Indications   Hypertension - Chronic/Pre-existing (ASA)      O10.019   Encounter for other antenatal screening        Z36.2   follow-up   [redacted] weeks gestation of pregnancy                D6Q.22   Obesity complicating pregnancy, second         O99.212   trimester (prepreg BMI 32.3)   Hypothyroid (Synthroid)                        O99.280 E03.9   Low Risk NIPS, neg Horizon   Poor obstetric history: Previous               O09.299   preeclampsia / eclampsia/gestational HTN   (IOL for precclampsa @ 40w, 1st pregnancy)  ---------------------------------------------------------------------- Fetal Evaluation  Num Of Fetuses:         1  Fetal Heart Rate(bpm):  143  Cardiac Activity:       Observed  Presentation:           Breech  Placenta:               Posterior  P. Cord Insertion:      Previously Visualized  Amniotic Fluid  AFI FV:      Within normal limits  AFI Sum(cm)     %Tile       Largest Pocket(cm)  10              15          4.94  RUQ(cm)       RLQ(cm)       LUQ(cm)        LLQ(cm)  2.65           2.41          4.94           0 ---------------------------------------------------------------------- Biometry  BPD:      77.8  mm     G. Age:  31w 2d         37  %    CI:        68.95   %    70 - 86  FL/HC:      19.5   %    19.3 - 21.3  HC:      299.3  mm     G. Age:  33w 1d         66  %    HC/AC:      1.07        0.96 - 1.17  AC:      279.2  mm     G. Age:  32w 0d         68  %    FL/BPD:     74.9   %    71 - 87  FL:       58.3  mm     G. Age:  30w 3d         17  %    FL/AC:      20.9   %    20 - 24  HUM:      53.9  mm     G. Age:  31w 2d         54  %  LV:        2.5  mm  Est. FW:    1793  gm    3 lb 15 oz      47  % ---------------------------------------------------------------------- OB History  Gravidity:    3         Term:   1        Prem:   0        SAB:   1  TOP:          0       Ectopic:  0        Living: 1 ---------------------------------------------------------------------- Gestational Age  U/S Today:     31w 5d                                        EDD:   11/29/19  Best:          31w 2d     Det. ByLoman Chroman         EDD:   12/02/19                                      (04/25/19) ---------------------------------------------------------------------- Anatomy  Cranium:               Appears normal         Aortic Arch:            Previously seen  Cavum:                 Previously seen        Ductal Arch:            Previously seen  Ventricles:            Appears normal         Diaphragm:              Appears normal  Choroid Plexus:        Previously seen        Stomach:                Appears normal, left  sided  Cerebellum:            Previously seen        Abdomen:                Previously seen  Posterior Fossa:       Previously seen        Abdominal Wall:         Previously seen  Nuchal Fold:           Previously seen        Cord Vessels:           Previously  seen  Face:                  Orbits and profile     Kidneys:                Appear normal                         previously seen  Lips:                  Previously seen        Bladder:                Appears normal  Thoracic:              Appears normal         Spine:                  Previously seen  Heart:                 Previously seen        Upper Extremities:      Previously seen  RVOT:                  Appears normal         Lower Extremities:      Previously seen  LVOT:                  Appears normal  Other:  Heels and 5th digit previously visualized. 3VV and 3VTV previously          visualized. Nasal bone previously visualized. ---------------------------------------------------------------------- Cervix Uterus Adnexa  Cervix  Not visualized (advanced GA >24wks) ---------------------------------------------------------------------- Impression  Chronic hypertension.  Patient does not take  antihypertensives for control.  Her blood pressures today at  our office were 155/91 and 168/105 mmHg.  Peak blood  pressure after ultrasound was 141/74 mmHg.  She does not  have symptoms of severe headache or visual disturbances or  right upper quadrant pain.  She complains of occasional  headaches.  Amniotic fluid is normal and good fetal activity is seen. Fetal  growth is appropriate for gestational age.  Patient has blood pressure cuff at home.  I discussed normal  parameters and asked her to contact your office if her blood  pressures are persistently above 956 mmHg systolic or 213  mmHg diastolic.  She has a Radiographer, therapeutic appointment on  10/04/19.  I informed the patient that she would need frequent  ultrasound appointments (BPP) if her blood pressures are not  within normal range or if she requires antihypertensives. ---------------------------------------------------------------------- Recommendations  -An appointment was made for her to return in 2 weeks for  BPP.  -Follow-up appointments for BPP will be decided  at her next  visit. ----------------------------------------------------------------------  Tama High, MD Electronically Signed Final Report   10/02/2019 03:03 pm ----------------------------------------------------------------------  US Abdomen Limited RUQ  Result Date: 10/16/2019 CLINICAL DATA:  29 year old female with right upper quadrant abdominal pain. EXAM: ULTRASOUND ABDOMEN LIMITED RIGHT UPPER QUADRANT COMPARISON:  None. FINDINGS: Gallbladder: No gallstones or wall thickening visualized. No sonographic Murphy sign noted by sonographer. Common bile duct: Diameter: 2 mm Liver: The liver is unremarkable. Portal vein is patent on color Doppler imaging with normal direction of blood flow towards the liver. Other: None. IMPRESSION: Unremarkable right upper quadrant ultrasound. Electronically Signed   By: Anner Crete M.D.   On: 10/16/2019 20:02   MAU Course  Procedures  MDM -preeclampsia evaluation without symptoms, but severe range BP at MFM earlier -UA: hazy, otherwise WNL -CBC: WNL, platelets 171 -CMP: WNL, AST/ALT 13/9, serum creatinine 0.47 -PCr: 0.12 -RUQ [place]: normal -consulted with Dr. Rosana Hoes, re: previous dx cHTN and elevated pressures, will start on Labetalol 211m BID and check BP on Wednesday. -EFM: reactive       -baseline: 140/145       -variability: moderate       -accels: present, 15x15       -decels: few variables       -TOCO: single ctx -BPP: 8/8 today in MFM -pt discharged to home in stable condition  Orders Placed This Encounter  Procedures  . [place]dom[place] Limited RUQ    Standing Status:   Standing    Number of Occurrences:   1    Order Specific Question:   Symptom/Reason for Exam    Answer:   RUQ pain [[[id number]] . Urinalysis, Routine w reflex microscopic    Standing Status:   Standing    Number of Occurrences:   1  . CBC    Standing Status:   Standing    Number of Occurrences:   1  . Comprehensive metabolic panel    Standing Status:    Standing    Number of Occurrences:   1  . Protein / creatinine ratio, urine    Standing Status:   Standing    Number of Occurrences:   1  . Discharge patient    Order Specific Question:   Discharge disposition    Answer:   01-Home or Self Care [1]    Order Specific Question:   Discharge patient date    Answer:   10/16/2019   Meds ordered this encounter  Medications  . labetalol (NORMODYNE) 200 MG tablet    Sig: Take 1 tablet (200 mg total) by mouth 2 (two) times daily.    Dispense:  60 tablet    Refill:  2    Order Specific Question:   Supervising Provider    Answer:   ERip Harbour MICHAEL L [[id number]]   Assessment and Plan   1. Chronic hypertension affecting pregnancy   2. RUQ pain   3. [redacted] weeks gestation of pregnancy   4. NST (non-stress test) reactive   5. Hx of preeclampsia, prior pregnancy, currently pregnant    Allergies as of 10/16/2019   No Known Allergies     Medication List    TAKE these medications   aspirin EC 81 MG tablet Take 1 tablet (81 mg total) by mouth daily.   Blood Pressure Kit Devi 1 Device by Does not apply route as needed. ICD 10:   O09.90   labetalol 200 MG tablet Commonly known as: NORMODYNE Take 1 tablet (200 mg total) by mouth 2 (two) times daily.  levothyroxine 125 MCG tablet Commonly known as: SYNTHROID TAKE 1 TABLET(125 MCG) BY MOUTH DAILY BEFORE BREAKFAST   multivitamin-prenatal 27-0.8 MG Tabs tablet Take 1 tablet by mouth daily at 12 noon.      -message sent to [facility] with high importance to schedule pt for BP check on Wednesday AM 10/18/2019 -RX labetalol 214m BID given -strict preeclampsia/return MAU precautions given -pt discharged to home in stable condition  NElmyra RicksE Nugent 10/16/2019, 8:41 PM

## 2019-10-16 NOTE — MAU Note (Signed)
[name] [name] is a 29 y.o. at [redacted]w[redacted]d here in MAU reporting: was sent over from the office for elevated blood pressures. Is not on HTN meds at home. Hx of preeclampsia. States she has been having bad headaches and RUQ pain. Denies either of these pains at this time. No visual changes. No bleeding or LOF. +FM  Onset of complaint: today  Pain score: 0/10  Vitals:   10/16/19 1822  BP: (!) 156/95  Pulse: 91  Resp: 16  Temp: 98.3 F (36.8 C)  SpO2: 99%     FHT: +FM  Lab orders placed from triage: UA

## 2019-10-16 NOTE — Discharge Instructions (Signed)
Preeclampsia and Eclampsia Preeclampsia is a serious condition that may develop during pregnancy. This condition causes high blood pressure and increased protein in your urine along with other symptoms, such as headaches and vision changes. These symptoms may develop as the condition gets worse. Preeclampsia may occur at 20 weeks of pregnancy or later. Diagnosing and treating preeclampsia early is very important. If not treated early, it can cause serious problems for you and your baby. One problem it can lead to is eclampsia. Eclampsia is a condition that causes muscle jerking or shaking (convulsions or seizures) and other serious problems for the mother. During pregnancy, delivering your baby may be the best treatment for preeclampsia or eclampsia. For most women, preeclampsia and eclampsia symptoms go away after giving birth. In rare cases, a woman may develop preeclampsia after giving birth (postpartum preeclampsia). This usually occurs within 48 hours after childbirth but may occur up to 6 weeks after giving birth. What are the causes? The cause of preeclampsia is not known. What increases the risk? The following risk factors make you more likely to develop preeclampsia:  Being pregnant for the first time.  Having had preeclampsia during a past pregnancy.  Having a family history of preeclampsia.  Having high blood pressure.  Being pregnant with more than one baby.  Being 35 or older.  Being African-American.  Having kidney disease or diabetes.  Having medical conditions such as lupus or blood diseases.  Being very overweight (obese). What are the signs or symptoms? The most common symptoms are:  Severe headaches.  Vision problems, such as blurred or double vision.  Abdominal pain, especially upper abdominal pain. Other symptoms that may develop as the condition gets worse include:  Sudden weight gain.  Sudden swelling of the hands, face, legs, and feet.  Severe nausea  and vomiting.  Numbness in the face, arms, legs, and feet.  Dizziness.  Urinating less than usual.  Slurred speech.  Convulsions or seizures. How is this diagnosed? There are no screening tests for preeclampsia. Your health care provider will ask you about symptoms and check for signs of preeclampsia during your prenatal visits. You may also have tests that include:  Checking your blood pressure.  Urine tests to check for protein. Your health care provider will check for this at every prenatal visit.  Blood tests.  Monitoring your baby's heart rate.  Ultrasound. How is this treated? You and your health care provider will determine the treatment approach that is best for you. Treatment may include:  Having more frequent prenatal exams to check for signs of preeclampsia, if you have an increased risk for preeclampsia.  Medicine to lower your blood pressure.  Staying in the hospital, if your condition is severe. There, treatment will focus on controlling your blood pressure and the amount of fluids in your body (fluid retention).  Taking medicine (magnesium sulfate) to prevent seizures. This may be given as an injection or through an IV.  Taking a low-dose aspirin during your pregnancy.  Delivering your baby early. You may have your labor started with medicine (induced), or you may have a cesarean delivery. Follow these instructions at home: Eating and drinking   Drink enough fluid to keep your urine pale yellow.  Avoid caffeine. Lifestyle  Do not use any products that contain nicotine or tobacco, such as cigarettes and e-cigarettes. If you need help quitting, ask your health care provider.  Do not use alcohol or drugs.  Avoid stress as much as possible. Rest and get   plenty of sleep. General instructions  Take over-the-counter and prescription medicines only as told by your health care provider.  When lying down, lie on your left side. This keeps pressure off your  major blood vessels.  When sitting or lying down, raise (elevate) your feet. Try putting some pillows underneath your lower legs.  Exercise regularly. Ask your health care provider what kinds of exercise are best for you.  Keep all follow-up and prenatal visits as told by your health care provider. This is important. How is this prevented? There is no known way of preventing preeclampsia or eclampsia from developing. However, to lower your risk of complications and detect problems early:  Get regular prenatal care. Your health care provider may be able to diagnose and treat the condition early.  Maintain a healthy weight. Ask your health care provider for help managing weight gain during pregnancy.  Work with your health care provider to manage any long-term (chronic) health conditions you have, such as diabetes or kidney problems.  You may have tests of your blood pressure and kidney function after giving birth.  Your health care provider may have you take low-dose aspirin during your next pregnancy. Contact a health care provider if:  You have symptoms that your health care provider told you may require more treatment or monitoring, such as: ? Headaches. ? Nausea or vomiting. ? Abdominal pain. ? Dizziness. ? Light-headedness. Get help right away if:  You have severe: ? Abdominal pain. ? Headaches that do not get better. ? Dizziness. ? Vision problems. ? Confusion. ? Nausea or vomiting.  You have any of the following: ? A seizure. ? Sudden, rapid weight gain. ? Sudden swelling in your hands, ankles, or face. ? Trouble moving any part of your body. ? Numbness in any part of your body. ? Trouble speaking. ? Abnormal bleeding.  You faint. Summary  Preeclampsia is a serious condition that may develop during pregnancy.  This condition causes high blood pressure and increased protein in your urine along with other symptoms, such as headaches and vision  changes.  Diagnosing and treating preeclampsia early is very important. If not treated early, it can cause serious problems for you and your baby.  Get help right away if you have symptoms that your health care provider told you to watch for. This information is not intended to replace advice given to you by your health care provider. Make sure you discuss any questions you have with your health care provider. Document Revised: 05/17/2018 Document Reviewed: 04/20/2016 Elsevier Patient Education  2020 [employer] Hypertension During Pregnancy High blood pressure (hypertension) is when the force of blood pumping through the arteries is too strong. Arteries are blood vessels that carry blood from the heart throughout the body. Hypertension during pregnancy can be mild or severe. Severe hypertension during pregnancy (preeclampsia) is a medical emergency that requires prompt evaluation and treatment. Different types of hypertension can happen during pregnancy. These include:  Chronic hypertension. This happens when you had high blood pressure before you became pregnant, and it continues during the pregnancy. Hypertension that develops before you are [redacted] weeks pregnant and continues during the pregnancy is also called chronic hypertension. If you have chronic hypertension, it will not go away after you have your baby. You will need follow-up visits with your health care provider after you have your baby. Your doctor may want you to keep taking medicine for your blood pressure.  Gestational hypertension. This is hypertension that develops after the 20th  week of pregnancy. Gestational hypertension usually goes away after you have your baby, but your health care provider will need to monitor your blood pressure to make sure that it is getting better.  Preeclampsia. This is severe hypertension during pregnancy. This can cause serious complications for you and your baby and can also cause complications for  you after the delivery of your baby.  Postpartum preeclampsia. You may develop severe hypertension after giving birth. This usually occurs within 48 hours after childbirth but may occur up to 6 weeks after giving birth. This is rare. How does this affect me? Women who have hypertension during pregnancy have a greater chance of developing hypertension later in life or during future pregnancies. In some cases, hypertension during pregnancy can cause serious complications, such as:  Stroke.  Heart attack.  Injury to other organs, such as kidneys, lungs, or liver.  Preeclampsia.  Convulsions or seizures.  Placental abruption. How does this affect my baby? Hypertension during pregnancy can affect your baby. Your baby may:  Be born early (prematurely).  Not weigh as much as he or she should at birth (low birth weight).  Not tolerate labor well, leading to an unplanned cesarean delivery. What are the risks? There are certain factors that make it more likely for you to develop hypertension during pregnancy. These include:  Having hypertension during a previous pregnancy.  Being overweight.  Being age 17 or older.  Being pregnant for the first time.  Being pregnant with more than one baby.  Becoming pregnant using fertilization methods, such as IVF (in vitro fertilization).  Having other medical problems, such as diabetes, kidney disease, or lupus.  Having a family history of hypertension. What can I do to lower my risk? The exact cause of hypertension during pregnancy is not known. You may be able to lower your risk by:  Maintaining a healthy weight.  Eating a healthy and balanced diet.  Following your health care provider's instructions about treating any long-term conditions that you had before becoming pregnant. It is very important to keep all of your prenatal care appointments. Your health care provider will check your blood pressure and make sure that your pregnancy  is progressing as expected. If a problem is found, early treatment can prevent complications. How is this treated? Treatment for hypertension during pregnancy varies depending on the type of hypertension you have and how serious it is.  If you were taking medicine for high blood pressure before you became pregnant, talk with your health care provider. You may need to change medicine during pregnancy because some medicines, like ACE inhibitors, may not be considered safe for your baby.  If you have gestational hypertension, your health care provider may order medicine to treat this during pregnancy.  If you are at risk for preeclampsia, your health care provider may recommend that you take a low-dose aspirin during your pregnancy.  If you have severe hypertension, you may need to be hospitalized so you and your baby can be monitored closely. You may also need to be given medicine to lower your blood pressure. This medicine may be given by mouth or through an IV.  In some cases, if your condition gets worse, you may need to deliver your baby early. Follow these instructions at home: Eating and drinking   Drink enough fluid to keep your urine pale yellow.  Avoid caffeine. Lifestyle  Do not use any products that contain nicotine or tobacco, such as cigarettes, e-cigarettes, and chewing tobacco. If you  need help quitting, ask your health care provider.  Do not use alcohol or drugs.  Avoid stress as much as possible.  Rest and get plenty of sleep.  Regular exercise can help to reduce your blood pressure. Ask your health care provider what kinds of exercise are best for you. General instructions  Take over-the-counter and prescription medicines only as told by your health care provider.  Keep all prenatal and follow-up visits as told by your health care provider. This is important. Contact a health care provider if:  You have symptoms that your health care provider told you may require  more treatment or monitoring, such as: ? Headaches. ? Nausea or vomiting. ? Abdominal pain. ? Dizziness. ? Light-headedness. Get help right away if:  You have: ? Severe abdominal pain that does not get better with treatment. ? A severe headache that does not get better. ? Vomiting that does not get better. ? Sudden, rapid weight gain. ? Sudden swelling in your hands, ankles, or face. ? Vaginal bleeding. ? Blood in your urine. ? Blurred or double vision. ? Shortness of breath or chest pain. ? Weakness on one side of your body. ? Difficulty speaking.  Your baby is not moving as much as usual. Summary  High blood pressure (hypertension) is when the force of blood pumping through the arteries is too strong.  Hypertension during pregnancy can cause problems for you and your baby.  Treatment for hypertension during pregnancy varies depending on the type of hypertension you have and how serious it is.  Keep all prenatal and follow-up visits as told by your health care provider. This is important. This information is not intended to replace advice given to you by your health care provider. Make sure you discuss any questions you have with your health care provider. Document Revised: 01/05/2019 Document Reviewed: 10/11/2018 Elsevier Patient Education  2020 [employer] Fetal Movement Counts Patient Name: ________________________________________________ Patient Due Date: ____________________ What is a fetal movement count?  A fetal movement count is the number of times that you feel your baby move during a certain amount of time. This may also be called a fetal kick count. A fetal movement count is recommended for every pregnant woman. You may be asked to start counting fetal movements as early as week 28 of your pregnancy. Pay attention to when your baby is most active. You may notice your baby's sleep and wake cycles. You may also notice things that make your baby move more. You  should do a fetal movement count:  When your baby is normally most active.  At the same time each day. A good time to count movements is while you are resting, after having something to eat and drink. How do I count fetal movements? 1. Find a quiet, comfortable area. Sit, or lie down on your side. 2. Write down the date, the start time and stop time, and the number of movements that you felt between those two times. Take this information with you to your health care visits. 3. Write down your start time when you feel the first movement. 4. Count kicks, flutters, swishes, rolls, and jabs. You should feel at least 10 movements. 5. You may stop counting after you have felt 10 movements, or if you have been counting for 2 hours. Write down the stop time. 6. If you do not feel 10 movements in 2 hours, contact your health care provider for further instructions. Your health care provider may want to do additional  tests to assess your baby's well-being. Contact a health care provider if:  You feel fewer than 10 movements in 2 hours.  Your baby is not moving like he or she usually does. Date: ____________ Start time: ____________ Stop time: ____________ Movements: ____________ Date: ____________ Start time: ____________ Stop time: ____________ Movements: ____________ Date: ____________ Start time: ____________ Stop time: ____________ Movements: ____________ Date: ____________ Start time: ____________ Stop time: ____________ Movements: ____________ Date: ____________ Start time: ____________ Stop time: ____________ Movements: ____________ Date: ____________ Start time: ____________ Stop time: ____________ Movements: ____________ Date: ____________ Start time: ____________ Stop time: ____________ Movements: ____________ Date: ____________ Start time: ____________ Stop time: ____________ Movements: ____________ Date: ____________ Start time: ____________ Stop time: ____________ Movements:  ____________ This information is not intended to replace advice given to you by your health care provider. Make sure you discuss any questions you have with your health care provider. Document Revised: 05/04/2019 Document Reviewed: 05/04/2019 Elsevier Patient Education  2020 [employer]. Preterm Labor and Birth Information  The normal length of a pregnancy is 39-41 weeks. Preterm labor is when labor starts before 37 completed weeks of pregnancy. What are the risk factors for preterm labor? Preterm labor is more likely to occur in women who:  Have certain infections during pregnancy such as a bladder infection, sexually transmitted infection, or infection inside the uterus (chorioamnionitis).  Have a shorter-than-normal cervix.  Have gone into preterm labor before.  Have had surgery on their cervix.  Are younger than age 37 or older than age 58.  Are African American.  Are pregnant with twins or multiple babies (multiple gestation).  Take street drugs or smoke while pregnant.  Do not gain enough weight while pregnant.  Became pregnant shortly after having been pregnant. What are the symptoms of preterm labor? Symptoms of preterm labor include:  Cramps similar to those that can happen during a menstrual period. The cramps may happen with diarrhea.  Pain in the abdomen or lower back.  Regular uterine contractions that may feel like tightening of the abdomen.  A feeling of increased pressure in the pelvis.  Increased watery or bloody mucus discharge from the vagina.  Water breaking (ruptured amniotic sac). Why is it important to recognize signs of preterm labor? It is important to recognize signs of preterm labor because babies who are born prematurely may not be fully developed. This can put them at an increased risk for:  Long-term (chronic) heart and lung problems.  Difficulty immediately after birth with regulating body systems, including blood sugar, body  temperature, heart rate, and breathing rate.  Bleeding in the brain.  Cerebral palsy.  Learning difficulties.  Death. These risks are highest for babies who are born before 34 weeks of pregnancy. How is preterm labor treated? Treatment depends on the length of your pregnancy, your condition, and the health of your baby. It may involve:  Having a stitch (suture) placed in your cervix to prevent your cervix from opening too early (cerclage).  Taking or being given medicines, such as: ? Hormone medicines. These may be given early in pregnancy to help support the pregnancy. ? Medicine to stop contractions. ? Medicines to help mature the baby's lungs. These may be prescribed if the risk of delivery is high. ? Medicines to prevent your baby from developing cerebral palsy. If the labor happens before 34 weeks of pregnancy, you may need to stay in the hospital. What should I do if I think I am in preterm labor? If you think that  you are going into preterm labor, call your health care provider right away. How can I prevent preterm labor in future pregnancies? To increase your chance of having a full-term pregnancy:  Do not use any tobacco products, such as cigarettes, chewing tobacco, and e-cigarettes. If you need help quitting, ask your health care provider.  Do not use street drugs or medicines that have not been prescribed to you during your pregnancy.  Talk with your health care provider before taking any herbal supplements, even if you have been taking them regularly.  Make sure you gain a healthy amount of weight during your pregnancy.  Watch for infection. If you think that you might have an infection, get it checked right away.  Make sure to tell your health care provider if you have gone into preterm labor before. This information is not intended to replace advice given to you by your health care provider. Make sure you discuss any questions you have with your health care  provider. Document Revised: 01/06/2019 Document Reviewed: 02/05/2016 Elsevier Patient Education  [employer].

## 2019-10-17 ENCOUNTER — Telehealth: Payer: Self-pay | Admitting: Women's Health

## 2019-10-17 NOTE — Telephone Encounter (Signed)
Received a call from the patient stating she was not able to keep this appointment because of lack of childcare.

## 2019-10-18 ENCOUNTER — Ambulatory Visit: Payer: Medicaid Other

## 2019-10-19 ENCOUNTER — Telehealth (INDEPENDENT_AMBULATORY_CARE_PROVIDER_SITE_OTHER): Payer: Medicaid Other | Admitting: Family Medicine

## 2019-10-19 VITALS — BP 161/105 | HR 92

## 2019-10-19 DIAGNOSIS — E039 Hypothyroidism, unspecified: Secondary | ICD-10-CM

## 2019-10-19 DIAGNOSIS — O10913 Unspecified pre-existing hypertension complicating pregnancy, third trimester: Secondary | ICD-10-CM

## 2019-10-19 DIAGNOSIS — O361930 Maternal care for other isoimmunization, third trimester, not applicable or unspecified: Secondary | ICD-10-CM

## 2019-10-19 DIAGNOSIS — Z3A33 33 weeks gestation of pregnancy: Secondary | ICD-10-CM

## 2019-10-19 DIAGNOSIS — O99283 Endocrine, nutritional and metabolic diseases complicating pregnancy, third trimester: Secondary | ICD-10-CM

## 2019-10-19 DIAGNOSIS — O10919 Unspecified pre-existing hypertension complicating pregnancy, unspecified trimester: Secondary | ICD-10-CM

## 2019-10-19 DIAGNOSIS — O0993 Supervision of high risk pregnancy, unspecified, third trimester: Secondary | ICD-10-CM

## 2019-10-19 DIAGNOSIS — O0990 Supervision of high risk pregnancy, unspecified, unspecified trimester: Secondary | ICD-10-CM

## 2019-10-19 NOTE — Patient Instructions (Signed)

## 2019-10-19 NOTE — Progress Notes (Signed)
I connected with  [name] [name] on 10/19/19 at  2:35 PM EST by telephone and verified that I am speaking with the correct person using two identifiers.   I discussed the limitations, risks, security and privacy concerns of performing an evaluation and management service by telephone and the availability of in person appointments. I also discussed with the patient that there may be a patient responsible charge related to this service. The patient expressed understanding and agreed to proceed.  Janene Madeira Takako Minckler, CMA 10/19/2019  2:09 PM   Will take blood pressure During Visit, just got home, wanted her to sit for a few mins before she took it.

## 2019-10-19 NOTE — Progress Notes (Signed)
TELEHEALTH OBSTETRICS PRENATAL VIRTUAL VIDEO VISIT ENCOUNTER NOTE  Provider location: [facility] for [facility] [place]   I connected with [name] [name] on 10/19/19 at  2:35 PM EST by WebEx Video Encounter at home and verified that I am speaking with the correct person using two identifiers.   I discussed the limitations, risks, security and privacy concerns of performing an evaluation and management service virtually and the availability of in person appointments. I also discussed with the patient that there may be a patient responsible charge related to this service. The patient expressed understanding and agreed to proceed. Subjective:  [name] [name] is a 29 y.o. G3P1011 at [redacted]w[redacted]d being seen today for ongoing prenatal care.  She is currently monitored for the following issues for this high-risk pregnancy and has Biological false positive RPR test; Supervision of high risk pregnancy, antepartum; History of pre-eclampsia; Obesity in pregnancy; BMI 30s; Chronic hypertension during pregnancy, antepartum; Hypothyroidism in pregnancy; Maternal atypical antibody; and UTI in pregnancy on their problem list.  Patient reports headache.  Contractions: Not present. Vag. Bleeding: None.  Movement: Present. Denies any leaking of fluid.   The following portions of the patient's history were reviewed and updated as appropriate: allergies, current medications, past family history, past medical history, past social history, past surgical history and problem list.   Objective:   Vitals:   10/19/19 1424  BP: (!) 161/105  Pulse: 92    Fetal Status:     Movement: Present     General:  Alert, oriented and cooperative. Patient is in no acute distress.  Respiratory: Normal respiratory effort, no problems with respiration noted  Mental Status: Normal mood and affect. Normal behavior. Normal judgment and thought content.  Rest of physical exam deferred due to type of encounter  Imaging: [place] MFM  FETAL BPP WO NON STRESS  Result Date: 10/16/2019 ----------------------------------------------------------------------  OBSTETRICS REPORT                       (Signed Final 10/16/2019 01:56 pm) ---------------------------------------------------------------------- Patient Info  ID #:       [id number]                          D.O.B.:  Apr 27, 1991 (28 yrs)  Name:       [name] [name]                 Visit Date: 10/16/2019 12:57 pm ---------------------------------------------------------------------- Performed By  Performed By:     Lenise Arena        Ref. Address:     [address]                    [place]                                                             [address]  Attending:        Ma Rings MD         [facility]  Referred By:      [facility] ---------------------------------------------------------------------- Orders   #  Description                          Code         Ordered By   1  [place] MFM FETAL BPP WO NON              76819.01     RAVI [facility]      STRESS  ----------------------------------------------------------------------   #  Order #                    Accession #                 Episode #   1  [id number]                  [id number]                  [id number]  ---------------------------------------------------------------------- Indications   Hypertension - Chronic/Pre-existing (ASA) -    O10.019   poor control   Encounter for other antenatal screening        Z36.2   follow-up   [redacted] weeks gestation of pregnancy                Z3A.33   Obesity complicating pregnancy, second         O99.212   trimester (prepreg BMI 32.3)   Hypothyroid (Synthroid)                        O99.280 E03.9   Low Risk NIPS, neg Horizon   Poor obstetric history: Previous               O09.299   preeclampsia / eclampsia/gestational HTN   (IOL for precclampsa @ 40w, 1st pregnancy)   ---------------------------------------------------------------------- Vital Signs  Weight (lb): 211                               Height:        5'6"  BMI:         34.05 ---------------------------------------------------------------------- Fetal Evaluation  Num Of Fetuses:         1  Fetal Heart Rate(bpm):  158  Cardiac Activity:       Observed  Presentation:           Cephalic  Placenta:               Posterior  P. Cord Insertion:      Previously Visualized  Amniotic Fluid  AFI FV:      Within normal limits  AFI Sum(cm)     %Tile       Largest Pocket(cm)  8.07            5           5.12  RUQ(cm)                                    LLQ(cm)  5.12                                       2.95 ---------------------------------------------------------------------- Biophysical Evaluation  Amniotic F.V:   Within normal limits       F. Tone:        Observed  F. Movement:    Observed                   Score:          8/8  F. Breathing:   Observed ---------------------------------------------------------------------- OB History  Gravidity:    3         Term:   1        Prem:   0        SAB:   1  TOP:          0       Ectopic:  0        Living: 1 ---------------------------------------------------------------------- Gestational Age  Best:          33w 2d     Det. ByMarcella Dubs         EDD:   12/02/19                                      (04/25/19) ---------------------------------------------------------------------- Anatomy  Thoracic:              Appears normal         Bladder:                Appears normal  Stomach:               Appears normal, left                         sided ---------------------------------------------------------------------- Cervix Uterus Adnexa  Cervix  Not visualized (advanced GA >24wks) ---------------------------------------------------------------------- Comments  This patient was seen for a biophysical profile due to a history  of preeclampsia in her prior pregnancy and chronic   hypertension that is not currently treated with any  medications.  The patient reports that she has been having  headaches on a daily basis over the past week.  The patient's  blood pressures today were noted to be 167/92, 160/94, and  160/84.  She is currently asymptomatic.  A biophysical profile performed today was 8 out of 8.  There was normal amniotic fluid noted on today's ultrasound  exam.  Due to her elevated blood pressures and daily headaches,  the patient was advised regarding my concern regarding  preeclampsia.  She was advised to go to the MAU for  assessment of preeclampsia.  Due to childcare issues, she  will be unable to go to the MAU until later this evening.  Preeclampsia precautions were reviewed today.  Should the patient remain undelivered, we will continue to  see her for weekly fetal testing.  A biophysical profile was scheduled in 1 week. ----------------------------------------------------------------------                   Ma Rings, MD Electronically Signed Final Report   10/16/2019 01:56 pm ----------------------------------------------------------------------  [place] MFM OB FOLLOW UP  Result Date: 10/02/2019 ----------------------------------------------------------------------  OBSTETRICS REPORT                       (Signed Final 10/02/2019 03:03 pm) ---------------------------------------------------------------------- Patient Info  ID #:       [id number]  D.O.B.:  11-Feb-1991 (28 yrs)  Name:       [name] [name]                 Visit Date: 10/02/2019 01:55 pm ---------------------------------------------------------------------- Performed By  Performed By:     Tomma Lightningevin Vics             Ref. Address:     [address]                    [place]                                                             [address]  Attending:        Noralee Spaceavi Shankar MD        [facility]   [facility]      [facility] ---------------------------------------------------------------------- Orders   #  Description                          Code         Ordered By   1  US MFM OB FOLLOW UP                  [id number]     YU FANG  ----------------------------------------------------------------------   #  Order #                    Accession #                 Episode #   1  [id number]                  [id number]                  [id number]  ---------------------------------------------------------------------- Indications   Hypertension - Chronic/Pre-existing (ASA)      O10.019   Encounter for other antenatal screening        Z36.2   follow-up   [redacted] weeks gestation of pregnancy                Z3A.31   Obesity complicating pregnancy, second         O99.212   trimester (prepreg BMI 32.3)   Hypothyroid (Synthroid)                        O99.280 E03.9   Low Risk NIPS, neg Horizon   Poor obstetric history: Previous               O09.299   preeclampsia / eclampsia/gestational HTN   (IOL for precclampsa @ 40w, 1st pregnancy)  ---------------------------------------------------------------------- Fetal Evaluation  Num Of Fetuses:         1  Fetal Heart Rate(bpm):  143  Cardiac Activity:  Observed  Presentation:           Breech  Placenta:               Posterior  P. Cord Insertion:      Previously Visualized  Amniotic Fluid  AFI FV:      Within normal limits  AFI Sum(cm)     %Tile       Largest Pocket(cm)  10              15          4.94  RUQ(cm)       RLQ(cm)       LUQ(cm)        LLQ(cm)  2.65          2.41          4.94           0 ---------------------------------------------------------------------- Biometry  BPD:      77.8  mm     G. Age:  31w 2d         37  %    CI:        68.95   %    70 - 86                                                          FL/HC:      19.5   %    19.3 - 21.3  HC:      299.3  mm     G. Age:  33w 1d         66  %    HC/AC:      1.07        0.96 - 1.17  AC:      279.2  mm     G. Age:   32w 0d         68  %    FL/BPD:     74.9   %    71 - 87  FL:       58.3  mm     G. Age:  30w 3d         17  %    FL/AC:      20.9   %    20 - 24  HUM:      53.9  mm     G. Age:  31w 2d         54  %  LV:        2.5  mm  Est. FW:    1793  gm    3 lb 15 oz      47  % ---------------------------------------------------------------------- OB History  Gravidity:    3         Term:   1        Prem:   0        SAB:   1  TOP:          0       Ectopic:  0        Living: 1 ---------------------------------------------------------------------- Gestational Age  U/S Today:     31w 5d  EDD:   11/29/19  Best:          31w 2d     Det. ByMarcella Dubs         EDD:   12/02/19                                      (04/25/19) ---------------------------------------------------------------------- Anatomy  Cranium:               Appears normal         Aortic Arch:            Previously seen  Cavum:                 Previously seen        Ductal Arch:            Previously seen  Ventricles:            Appears normal         Diaphragm:              Appears normal  Choroid Plexus:        Previously seen        Stomach:                Appears normal, left                                                                        sided  Cerebellum:            Previously seen        Abdomen:                Previously seen  Posterior Fossa:       Previously seen        Abdominal Wall:         Previously seen  Nuchal Fold:           Previously seen        Cord Vessels:           Previously seen  Face:                  Orbits and profile     Kidneys:                Appear normal                         previously seen  Lips:                  Previously seen        Bladder:                Appears normal  Thoracic:              Appears normal         Spine:                  Previously seen  Heart:                 Previously seen  Upper Extremities:      Previously seen  RVOT:                  Appears  normal         Lower Extremities:      Previously seen  LVOT:                  Appears normal  Other:  Heels and 5th digit previously visualized. 3VV and 3VTV previously          visualized. Nasal bone previously visualized. ---------------------------------------------------------------------- Cervix Uterus Adnexa  Cervix  Not visualized (advanced GA >24wks) ---------------------------------------------------------------------- Impression  Chronic hypertension.  Patient does not take  antihypertensives for control.  Her blood pressures today at  our office were 155/91 and 168/105 mmHg.  Peak blood  pressure after ultrasound was 141/74 mmHg.  She does not  have symptoms of severe headache or visual disturbances or  right upper quadrant pain.  She complains of occasional  headaches.  Amniotic fluid is normal and good fetal activity is seen. Fetal  growth is appropriate for gestational age.  Patient has blood pressure cuff at home.  I discussed normal  parameters and asked her to contact your office if her blood  pressures are persistently above 536 mmHg systolic or 144  mmHg diastolic.  She has a Radiographer, therapeutic appointment on  10/04/19.  I informed the patient that she would need frequent  ultrasound appointments (BPP) if her blood pressures are not  within normal range or if she requires antihypertensives. ---------------------------------------------------------------------- Recommendations  -An appointment was made for her to return in 2 weeks for  BPP.  -Follow-up appointments for BPP will be decided at her next  visit. ----------------------------------------------------------------------                  Tama High, MD Electronically Signed Final Report   10/02/2019 03:03 pm ----------------------------------------------------------------------  US Abdomen Limited RUQ  Result Date: 10/16/2019 CLINICAL DATA:  29 year old female with right upper quadrant abdominal pain. EXAM: ULTRASOUND ABDOMEN LIMITED RIGHT  UPPER QUADRANT COMPARISON:  None. FINDINGS: Gallbladder: No gallstones or wall thickening visualized. No sonographic Murphy sign noted by sonographer. Common bile duct: Diameter: 2 mm Liver: The liver is unremarkable. Portal vein is patent on color Doppler imaging with normal direction of blood flow towards the liver. Other: None. IMPRESSION: Unremarkable right upper quadrant ultrasound. Electronically Signed   By: Anner Crete M.D.   On: 10/16/2019 20:02    Assessment and Plan:  Pregnancy: G3P1011 at [redacted]w[redacted]d 1. Chronic hypertension during pregnancy, antepartum Worsening BP--started on Labetalol and BP is up further today--h/o Pre-eclampsia at 34 wks last pregnancy--had labs and w/u on Monday--to repeat BP tonight and in the morning. If still elevated-->for admission to [facility] for BMZ and decision around delivery at 34 wks, for SIPE with severe features based on worsening BP vs. Admission and then possibly delivery at 37 wks. Last u/s for growth WNL--BPPs with MFM  2. Hypothyroidism during pregnancy in third trimester Last TSH showed too much thyroid on board.  3. Maternal atypical antibody affecting pregnancy in third trimester, single or unspecified fetus Unknown which AB is positive.  4. Supervision of high risk pregnancy, antepartum   Preterm labor symptoms and general obstetric precautions including but not limited to vaginal bleeding, contractions, leaking of fluid and fetal movement were reviewed in detail with the patient. I discussed the assessment and treatment plan with the patient. The patient was provided an opportunity to ask  questions and all were answered. The patient agreed with the plan and demonstrated an understanding of the instructions. The patient was advised to call back or seek an in-person office evaluation/[facility] for any urgent or concerning symptoms. Please refer to After Visit Summary for other counseling recommendations.   I  provided 16 minutes of face-to-face time during this encounter.  Return in 1 day (on 10/20/2019) for BP check with nurse in am.  Future Appointments  Date Time Provider Department Center  10/23/2019  7:30 AM [facility]-M[facility] NURSE [facility]-M[facility] MFC-US  10/23/2019  7:30 AM [facility] US 4 WH-MFCUS MFC-US  10/31/2019 11:00 AM [facility] NURSE WH-MFC MFC-US  10/31/2019 11:00 AM [facility]-M[facility] US 3 WH-MFCUS MFC-US    Reva Boresanya S Jaydan Meidinger, MD [facility]

## 2019-10-20 ENCOUNTER — Encounter: Payer: Self-pay | Admitting: Lactation Services

## 2019-10-20 NOTE — Progress Notes (Signed)
Called Dr. Shawnie Pons and reported pts BP last night of 158/94 HR 90 and BP this morning of 138/95 HR 92  Pt reports headache that she usually has with no dizziness or blurred vision.   Dr. Shawnie Pons asked that pt be informed to check her BP BID and is greater than or equal to 160/110 and/or she experiences worsening headache, she is to go to MAU for evaluation.   Will send My Chart message to pt.

## 2019-10-23 ENCOUNTER — Ambulatory Visit (HOSPITAL_COMMUNITY): Payer: Medicaid Other | Admitting: *Deleted

## 2019-10-23 ENCOUNTER — Other Ambulatory Visit (HOSPITAL_COMMUNITY): Payer: Self-pay | Admitting: *Deleted

## 2019-10-23 ENCOUNTER — Other Ambulatory Visit: Payer: Self-pay

## 2019-10-23 ENCOUNTER — Encounter (HOSPITAL_COMMUNITY): Payer: Self-pay

## 2019-10-23 ENCOUNTER — Ambulatory Visit (HOSPITAL_COMMUNITY)
Admission: RE | Admit: 2019-10-23 | Discharge: 2019-10-23 | Disposition: A | Discharge status: Home / Self Care | Payer: Medicaid Other | Source: Ambulatory Visit | Attending: Obstetrics and Gynecology | Admitting: Obstetrics and Gynecology

## 2019-10-23 DIAGNOSIS — Z3A34 34 weeks gestation of pregnancy: Secondary | ICD-10-CM | POA: Diagnosis not present

## 2019-10-23 DIAGNOSIS — O0990 Supervision of high risk pregnancy, unspecified, unspecified trimester: Secondary | ICD-10-CM | POA: Insufficient documentation

## 2019-10-23 DIAGNOSIS — Z8759 Personal history of other complications of pregnancy, childbirth and the puerperium: Secondary | ICD-10-CM

## 2019-10-23 DIAGNOSIS — O99213 Obesity complicating pregnancy, third trimester: Secondary | ICD-10-CM | POA: Diagnosis not present

## 2019-10-23 DIAGNOSIS — O99210 Obesity complicating pregnancy, unspecified trimester: Secondary | ICD-10-CM | POA: Insufficient documentation

## 2019-10-23 DIAGNOSIS — E039 Hypothyroidism, unspecified: Secondary | ICD-10-CM | POA: Diagnosis not present

## 2019-10-23 DIAGNOSIS — O10013 Pre-existing essential hypertension complicating pregnancy, third trimester: Secondary | ICD-10-CM | POA: Diagnosis not present

## 2019-10-23 DIAGNOSIS — O99283 Endocrine, nutritional and metabolic diseases complicating pregnancy, third trimester: Secondary | ICD-10-CM | POA: Diagnosis not present

## 2019-10-23 DIAGNOSIS — O139 Gestational [pregnancy-induced] hypertension without significant proteinuria, unspecified trimester: Secondary | ICD-10-CM

## 2019-10-23 DIAGNOSIS — O10919 Unspecified pre-existing hypertension complicating pregnancy, unspecified trimester: Secondary | ICD-10-CM | POA: Diagnosis not present

## 2019-10-31 ENCOUNTER — Ambulatory Visit (HOSPITAL_COMMUNITY): Payer: Medicaid Other | Admitting: *Deleted

## 2019-10-31 ENCOUNTER — Ambulatory Visit (HOSPITAL_COMMUNITY): Payer: Medicaid Other

## 2019-10-31 ENCOUNTER — Other Ambulatory Visit: Payer: Self-pay

## 2019-10-31 ENCOUNTER — Ambulatory Visit (HOSPITAL_COMMUNITY)
Admission: RE | Admit: 2019-10-31 | Discharge: 2019-10-31 | Disposition: A | Discharge status: Home / Self Care | Payer: Medicaid Other | Source: Ambulatory Visit | Attending: Obstetrics and Gynecology | Admitting: Obstetrics and Gynecology

## 2019-10-31 ENCOUNTER — Encounter (HOSPITAL_COMMUNITY): Payer: Self-pay

## 2019-10-31 DIAGNOSIS — O0990 Supervision of high risk pregnancy, unspecified, unspecified trimester: Secondary | ICD-10-CM

## 2019-10-31 DIAGNOSIS — O99283 Endocrine, nutritional and metabolic diseases complicating pregnancy, third trimester: Secondary | ICD-10-CM | POA: Diagnosis not present

## 2019-10-31 DIAGNOSIS — Z8759 Personal history of other complications of pregnancy, childbirth and the puerperium: Secondary | ICD-10-CM | POA: Diagnosis not present

## 2019-10-31 DIAGNOSIS — Z362 Encounter for other antenatal screening follow-up: Secondary | ICD-10-CM

## 2019-10-31 DIAGNOSIS — O10919 Unspecified pre-existing hypertension complicating pregnancy, unspecified trimester: Secondary | ICD-10-CM | POA: Insufficient documentation

## 2019-10-31 DIAGNOSIS — O99210 Obesity complicating pregnancy, unspecified trimester: Secondary | ICD-10-CM | POA: Diagnosis not present

## 2019-10-31 DIAGNOSIS — E039 Hypothyroidism, unspecified: Secondary | ICD-10-CM

## 2019-10-31 DIAGNOSIS — O10013 Pre-existing essential hypertension complicating pregnancy, third trimester: Secondary | ICD-10-CM

## 2019-10-31 DIAGNOSIS — O139 Gestational [pregnancy-induced] hypertension without significant proteinuria, unspecified trimester: Secondary | ICD-10-CM | POA: Diagnosis not present

## 2019-10-31 DIAGNOSIS — O99213 Obesity complicating pregnancy, third trimester: Secondary | ICD-10-CM

## 2019-10-31 DIAGNOSIS — Z3A35 35 weeks gestation of pregnancy: Secondary | ICD-10-CM

## 2019-11-07 ENCOUNTER — Ambulatory Visit (HOSPITAL_COMMUNITY): Payer: Medicaid Other | Admitting: *Deleted

## 2019-11-07 ENCOUNTER — Other Ambulatory Visit: Payer: Self-pay

## 2019-11-07 ENCOUNTER — Ambulatory Visit (HOSPITAL_COMMUNITY)
Admission: RE | Admit: 2019-11-07 | Discharge: 2019-11-07 | Disposition: A | Discharge status: Home / Self Care | Payer: Medicaid Other | Source: Ambulatory Visit | Attending: Obstetrics and Gynecology | Admitting: Obstetrics and Gynecology

## 2019-11-07 ENCOUNTER — Encounter (HOSPITAL_COMMUNITY): Payer: Self-pay

## 2019-11-07 DIAGNOSIS — O99213 Obesity complicating pregnancy, third trimester: Secondary | ICD-10-CM | POA: Diagnosis not present

## 2019-11-07 DIAGNOSIS — O99210 Obesity complicating pregnancy, unspecified trimester: Secondary | ICD-10-CM | POA: Insufficient documentation

## 2019-11-07 DIAGNOSIS — O99283 Endocrine, nutritional and metabolic diseases complicating pregnancy, third trimester: Secondary | ICD-10-CM

## 2019-11-07 DIAGNOSIS — Z3A36 36 weeks gestation of pregnancy: Secondary | ICD-10-CM

## 2019-11-07 DIAGNOSIS — Z8759 Personal history of other complications of pregnancy, childbirth and the puerperium: Secondary | ICD-10-CM | POA: Insufficient documentation

## 2019-11-07 DIAGNOSIS — O139 Gestational [pregnancy-induced] hypertension without significant proteinuria, unspecified trimester: Secondary | ICD-10-CM | POA: Insufficient documentation

## 2019-11-07 DIAGNOSIS — E039 Hypothyroidism, unspecified: Secondary | ICD-10-CM | POA: Diagnosis not present

## 2019-11-07 DIAGNOSIS — O10013 Pre-existing essential hypertension complicating pregnancy, third trimester: Secondary | ICD-10-CM | POA: Diagnosis not present

## 2019-11-07 DIAGNOSIS — O0990 Supervision of high risk pregnancy, unspecified, unspecified trimester: Secondary | ICD-10-CM | POA: Insufficient documentation

## 2019-11-09 ENCOUNTER — Ambulatory Visit (INDEPENDENT_AMBULATORY_CARE_PROVIDER_SITE_OTHER): Payer: Medicaid Other | Admitting: Obstetrics & Gynecology

## 2019-11-09 ENCOUNTER — Encounter: Payer: Self-pay | Admitting: Obstetrics & Gynecology

## 2019-11-09 ENCOUNTER — Inpatient Hospital Stay (HOSPITAL_COMMUNITY): Payer: Medicaid Other | Admitting: Anesthesiology

## 2019-11-09 ENCOUNTER — Other Ambulatory Visit: Payer: Self-pay

## 2019-11-09 ENCOUNTER — Other Ambulatory Visit (HOSPITAL_COMMUNITY)
Admission: RE | Admit: 2019-11-09 | Discharge: 2019-11-09 | Disposition: A | Discharge status: Home / Self Care | Payer: Medicaid Other | Source: Ambulatory Visit | Attending: Obstetrics & Gynecology | Admitting: Obstetrics & Gynecology

## 2019-11-09 ENCOUNTER — Inpatient Hospital Stay (HOSPITAL_COMMUNITY)
Admission: AD | Admit: 2019-11-09 | Discharge: 2019-11-12 | DRG: 768 | Disposition: A | Discharge status: Home / Self Care | Payer: Medicaid Other | Attending: Obstetrics & Gynecology | Admitting: Obstetrics & Gynecology

## 2019-11-09 ENCOUNTER — Encounter (HOSPITAL_COMMUNITY): Payer: Self-pay | Admitting: Obstetrics & Gynecology

## 2019-11-09 VITALS — BP 195/142 | HR 106 | Wt 219.0 lb

## 2019-11-09 DIAGNOSIS — R7689 Other specified abnormal immunological findings in serum: Secondary | ICD-10-CM | POA: Diagnosis present

## 2019-11-09 DIAGNOSIS — E669 Obesity, unspecified: Secondary | ICD-10-CM | POA: Diagnosis present

## 2019-11-09 DIAGNOSIS — O99214 Obesity complicating childbirth: Secondary | ICD-10-CM | POA: Diagnosis present

## 2019-11-09 DIAGNOSIS — Z20822 Contact with and (suspected) exposure to covid-19: Secondary | ICD-10-CM | POA: Diagnosis present

## 2019-11-09 DIAGNOSIS — R768 Other specified abnormal immunological findings in serum: Secondary | ICD-10-CM | POA: Diagnosis present

## 2019-11-09 DIAGNOSIS — E039 Hypothyroidism, unspecified: Secondary | ICD-10-CM | POA: Diagnosis present

## 2019-11-09 DIAGNOSIS — O0990 Supervision of high risk pregnancy, unspecified, unspecified trimester: Secondary | ICD-10-CM

## 2019-11-09 DIAGNOSIS — O99284 Endocrine, nutritional and metabolic diseases complicating childbirth: Secondary | ICD-10-CM | POA: Diagnosis present

## 2019-11-09 DIAGNOSIS — Z3A36 36 weeks gestation of pregnancy: Secondary | ICD-10-CM | POA: Diagnosis not present

## 2019-11-09 DIAGNOSIS — O1002 Pre-existing essential hypertension complicating childbirth: Secondary | ICD-10-CM | POA: Diagnosis present

## 2019-11-09 DIAGNOSIS — O113 Pre-existing hypertension with pre-eclampsia, third trimester: Secondary | ICD-10-CM

## 2019-11-09 DIAGNOSIS — Z8759 Personal history of other complications of pregnancy, childbirth and the puerperium: Secondary | ICD-10-CM

## 2019-11-09 DIAGNOSIS — O99210 Obesity complicating pregnancy, unspecified trimester: Secondary | ICD-10-CM | POA: Diagnosis present

## 2019-11-09 DIAGNOSIS — O114 Pre-existing hypertension with pre-eclampsia, complicating childbirth: Secondary | ICD-10-CM | POA: Diagnosis not present

## 2019-11-09 DIAGNOSIS — O0993 Supervision of high risk pregnancy, unspecified, third trimester: Secondary | ICD-10-CM

## 2019-11-09 DIAGNOSIS — O10919 Unspecified pre-existing hypertension complicating pregnancy, unspecified trimester: Secondary | ICD-10-CM | POA: Diagnosis present

## 2019-11-09 DIAGNOSIS — O119 Pre-existing hypertension with pre-eclampsia, unspecified trimester: Secondary | ICD-10-CM | POA: Insufficient documentation

## 2019-11-09 DIAGNOSIS — O09299 Supervision of pregnancy with other poor reproductive or obstetric history, unspecified trimester: Secondary | ICD-10-CM

## 2019-11-09 LAB — CBC
HCT: 35.7 % — ABNORMAL LOW (ref 36.0–46.0)
Hemoglobin: 11.9 g/dL — ABNORMAL LOW (ref 12.0–15.0)
MCH: 29 pg (ref 26.0–34.0)
MCHC: 33.3 g/dL (ref 30.0–36.0)
MCV: 86.9 fL (ref 80.0–100.0)
Platelets: 174 10*3/uL (ref 150–400)
RBC: 4.11 MIL/uL (ref 3.87–5.11)
RDW: 13.6 % (ref 11.5–15.5)
WBC: 10 10*3/uL (ref 4.0–10.5)
nRBC: 0 % (ref 0.0–0.2)

## 2019-11-09 LAB — COMPREHENSIVE METABOLIC PANEL
ALT: 10 U/L (ref 0–44)
AST: 15 U/L (ref 15–41)
Albumin: 3.3 g/dL — ABNORMAL LOW (ref 3.5–5.0)
Alkaline Phosphatase: 60 U/L (ref 38–126)
Anion gap: 13 (ref 5–15)
BUN: 6 mg/dL (ref 6–20)
CO2: 18 mmol/L — ABNORMAL LOW (ref 22–32)
Calcium: 9.3 mg/dL (ref 8.9–10.3)
Chloride: 105 mmol/L (ref 98–111)
Creatinine, Ser: 0.54 mg/dL (ref 0.44–1.00)
GFR calc Af Amer: >60 mL/min (ref >60)
GFR calc non Af Amer: >60 mL/min (ref >60)
Glucose, Bld: 92 mg/dL (ref 70–99)
Potassium: 3.5 mmol/L (ref 3.5–5.1)
Sodium: 136 mmol/L (ref 135–145)
Total Bilirubin: 0.8 mg/dL (ref 0.3–1.2)
Total Protein: 6.8 g/dL (ref 6.5–8.1)

## 2019-11-09 LAB — PROTEIN / CREATININE RATIO, URINE
Creatinine, Urine: 121.54 mg/dL
Protein Creatinine Ratio: 0.12 mg/mg{Cre} (ref 0.00–0.15)
Total Protein, Urine: 14 mg/dL

## 2019-11-09 LAB — ABO/RH: ABO/RH(D): O POS

## 2019-11-09 LAB — SARS CORONAVIRUS 2 (TAT 6-24 HRS): SARS Coronavirus 2: NEGATIVE

## 2019-11-09 MED ORDER — FENTANYL-BUPIVACAINE-NACL 0.5-0.125-0.9 MG/250ML-% EP SOLN
12.0000 mL/h | EPIDURAL | Status: DC | PRN
  Administered 2019-11-10: 12 mL/h via EPIDURAL
  Filled 2019-11-09 (×2): qty 250

## 2019-11-09 MED ORDER — LACTATED RINGERS IV SOLN: INTRAVENOUS | Status: DC

## 2019-11-09 MED ORDER — TERBUTALINE SULFATE 1 MG/ML IJ SOLN: 0.2500 mg | Freq: Once | INTRAMUSCULAR | Status: DC | PRN

## 2019-11-09 MED ORDER — MAGNESIUM SULFATE BOLUS VIA INFUSION
4.0000 g | Freq: Once | INTRAVENOUS | Status: AC
  Administered 2019-11-09: 4 g via INTRAVENOUS
  Filled 2019-11-09: qty 1000

## 2019-11-09 MED ORDER — SODIUM CHLORIDE (PF) 0.9 % IJ SOLN
INTRAMUSCULAR | Status: DC | PRN
  Administered 2019-11-09: 12 mL/h via EPIDURAL

## 2019-11-09 MED ORDER — ACETAMINOPHEN 325 MG PO TABS: 650.0000 mg | ORAL_TABLET | ORAL | Status: DC | PRN

## 2019-11-09 MED ORDER — SOD CITRATE-CITRIC ACID 500-334 MG/5ML PO SOLN: 30.0000 mL | ORAL | Status: DC | PRN

## 2019-11-09 MED ORDER — ONDANSETRON HCL 4 MG/2ML IJ SOLN
4.0000 mg | Freq: Four times a day (QID) | INTRAMUSCULAR | Status: DC | PRN
  Administered 2019-11-10 (×2): 4 mg via INTRAVENOUS
  Filled 2019-11-09 (×2): qty 2

## 2019-11-09 MED ORDER — SODIUM CHLORIDE 0.9 % IV SOLN
5.0000 10*6.[IU] | Freq: Once | INTRAVENOUS | Status: AC
  Administered 2019-11-09: 5 10*6.[IU] via INTRAVENOUS
  Filled 2019-11-09: qty 5

## 2019-11-09 MED ORDER — OXYTOCIN 40 UNITS IN NORMAL SALINE INFUSION - SIMPLE MED
2.5000 [IU]/h | INTRAVENOUS | Status: DC
  Administered 2019-11-10: 2.5 [IU]/h via INTRAVENOUS

## 2019-11-09 MED ORDER — OXYCODONE-ACETAMINOPHEN 5-325 MG PO TABS: 1.0000 | ORAL_TABLET | ORAL | Status: DC | PRN

## 2019-11-09 MED ORDER — LABETALOL HCL 5 MG/ML IV SOLN: 20.0000 mg | INTRAVENOUS | Status: DC | PRN

## 2019-11-09 MED ORDER — OXYTOCIN BOLUS FROM INFUSION
500.0000 mL | Freq: Once | INTRAVENOUS | Status: AC
  Administered 2019-11-10: 500 mL via INTRAVENOUS

## 2019-11-09 MED ORDER — OXYTOCIN 40 UNITS IN NORMAL SALINE INFUSION - SIMPLE MED
1.0000 m[IU]/min | INTRAVENOUS | Status: DC
  Administered 2019-11-10: 2 m[IU]/min via INTRAVENOUS
  Filled 2019-11-09: qty 1000

## 2019-11-09 MED ORDER — OXYCODONE-ACETAMINOPHEN 5-325 MG PO TABS: 2.0000 | ORAL_TABLET | ORAL | Status: DC | PRN

## 2019-11-09 MED ORDER — HYDRALAZINE HCL 20 MG/ML IJ SOLN: 10.0000 mg | INTRAMUSCULAR | Status: DC | PRN

## 2019-11-09 MED ORDER — FLEET ENEMA 7-19 GM/118ML RE ENEM: 1.0000 | ENEMA | Freq: Every day | RECTAL | Status: DC | PRN

## 2019-11-09 MED ORDER — FENTANYL CITRATE (PF) 100 MCG/2ML IJ SOLN
50.0000 ug | INTRAMUSCULAR | Status: DC | PRN
  Administered 2019-11-09: 50 ug via INTRAVENOUS
  Filled 2019-11-09: qty 2

## 2019-11-09 MED ORDER — MISOPROSTOL 25 MCG QUARTER TABLET: 25.0000 ug | ORAL_TABLET | ORAL | Status: DC | PRN

## 2019-11-09 MED ORDER — HYDROXYZINE HCL 50 MG PO TABS
50.0000 mg | ORAL_TABLET | Freq: Four times a day (QID) | ORAL | Status: DC | PRN
  Filled 2019-11-09: qty 1

## 2019-11-09 MED ORDER — LIDOCAINE HCL (PF) 1 % IJ SOLN
30.0000 mL | INTRAMUSCULAR | Status: AC | PRN
  Administered 2019-11-10: 30 mL via SUBCUTANEOUS
  Filled 2019-11-09: qty 30

## 2019-11-09 MED ORDER — PHENYLEPHRINE 40 MCG/ML (10ML) SYRINGE FOR IV PUSH (FOR BLOOD PRESSURE SUPPORT): 80.0000 ug | PREFILLED_SYRINGE | INTRAVENOUS | Status: DC | PRN

## 2019-11-09 MED ORDER — LABETALOL HCL 5 MG/ML IV SOLN: 40.0000 mg | INTRAVENOUS | Status: DC | PRN

## 2019-11-09 MED ORDER — EPHEDRINE 5 MG/ML INJ: 10.0000 mg | INTRAVENOUS | Status: DC | PRN

## 2019-11-09 MED ORDER — MISOPROSTOL 50MCG HALF TABLET
50.0000 ug | ORAL_TABLET | Freq: Once | ORAL | Status: AC
  Administered 2019-11-09: 50 ug via BUCCAL
  Filled 2019-11-09: qty 1

## 2019-11-09 MED ORDER — LABETALOL HCL 200 MG PO TABS
200.0000 mg | ORAL_TABLET | Freq: Two times a day (BID) | ORAL | Status: DC
  Administered 2019-11-09: 200 mg via ORAL
  Filled 2019-11-09: qty 1

## 2019-11-09 MED ORDER — ZOLPIDEM TARTRATE 5 MG PO TABS: 5.0000 mg | ORAL_TABLET | Freq: Every evening | ORAL | Status: DC | PRN

## 2019-11-09 MED ORDER — MAGNESIUM SULFATE 40 GM/1000ML IV SOLN
2.0000 g/h | INTRAVENOUS | Status: DC
  Administered 2019-11-10: 2 g/h via INTRAVENOUS
  Filled 2019-11-09 (×2): qty 1000

## 2019-11-09 MED ORDER — LEVOTHYROXINE SODIUM 25 MCG PO TABS
125.0000 ug | ORAL_TABLET | Freq: Every day | ORAL | Status: DC
  Administered 2019-11-10 – 2019-11-12 (×3): 125 ug via ORAL
  Filled 2019-11-09: qty 1
  Filled 2019-11-09: qty 5
  Filled 2019-11-09: qty 1

## 2019-11-09 MED ORDER — PENICILLIN G POT IN DEXTROSE 60000 UNIT/ML IV SOLN
3.0000 10*6.[IU] | INTRAVENOUS | Status: DC
  Administered 2019-11-10 (×5): 3 10*6.[IU] via INTRAVENOUS
  Filled 2019-11-09 (×5): qty 50

## 2019-11-09 MED ORDER — LACTATED RINGERS IV SOLN
500.0000 mL | Freq: Once | INTRAVENOUS | Status: AC
  Administered 2019-11-09: 500 mL via INTRAVENOUS

## 2019-11-09 MED ORDER — HYDRALAZINE HCL 20 MG/ML IJ SOLN: 5.0000 mg | INTRAMUSCULAR | Status: DC | PRN

## 2019-11-09 MED ORDER — DIPHENHYDRAMINE HCL 50 MG/ML IJ SOLN: 12.5000 mg | INTRAMUSCULAR | Status: DC | PRN

## 2019-11-09 MED ORDER — LACTATED RINGERS IV SOLN: 500.0000 mL | INTRAVENOUS | Status: DC | PRN

## 2019-11-09 MED ORDER — LIDOCAINE HCL (PF) 1 % IJ SOLN
INTRAMUSCULAR | Status: DC | PRN
  Administered 2019-11-09: 5 mL via EPIDURAL

## 2019-11-09 NOTE — Progress Notes (Signed)
Patient reports daily headache in morning and evening but denies dizziness or blurry vision

## 2019-11-09 NOTE — H&P (Addendum)
OBSTETRIC ADMISSION HISTORY AND PHYSICAL  [name] [name] is a 29 y.o. female G3P1011 with IUP at 31w5dby 8 wk [place] for IOL 2/2 cHTN w/ SIPE w/ SF (BP, HA). She reports FMs, No LOF, no VB, no blurry vision, peripheral edema, or RUQ pain. She endorses headachees. She plans on breast feeding. She requests natural family planning for birth control. [place] Dating: By 04/25/19 [place]--->  Estimated Date of Delivery: 12/02/19  Sono:   158w5dCWD, normal anatomy, Frank breech presentation, posterior placental lie, 318 g, 79% EFW 2318w2dWD, normal anatomy, cephalic presentation, [id number] 50 % EFW 37w54w0dD, normal anatomy, cephalic presentation, [id number]% EFW  Prenatal History/Complications: Chronic HTN, ASA, labetalol Obesity, BMI 35 Biological false positive RPR, most recent titer 1:2, T. Pallidum abs negative Hypothyroidism, on synthroid Maternal atypical antibody UTI in pregnancy, TOC negative 05/2019 H/O PEC  Past Medical History: Past Medical History:  Diagnosis Date  . Hypothyroidism   . Preeclampsia     Past Surgical History: Past Surgical History:  Procedure Laterality Date  . penny removed from throat     as a child. no scar noted.    Obstetrical History: OB History    Gravida  3   Para  1   Term  1   Preterm  0   AB  1   Living  1     SAB  1   TAB  0   Ectopic  0   Multiple  0   Live Births  1           Social History Social History   Socioeconomic History  . Marital status: Single    Spouse name: Not on file  . Number of children: Not on file  . Years of education: Not on file  . Highest education level: Not on file  Occupational History  . Not on file  Tobacco Use  . Smoking status: Never Smoker  . Smokeless tobacco: Never Used  Substance and Sexual Activity  . Alcohol use: No  . Drug use: No  . Sexual activity: Yes  Other Topics Concern  . Not on file  Social History Narrative  . Not on  file   Social Determinants of Health   Financial Resource Strain:   . Difficulty of Paying Living Expenses: Not on file  Food Insecurity: No Food Insecurity  . Worried About RunnCharity fundraiserthe Last Year: Never true  . Ran Out of Food in the Last Year: Never true  Transportation Needs: No Transportation Needs  . Lack of Transportation (Medical): No  . Lack of Transportation (Non-Medical): No  Physical Activity:   . Days of Exercise per Week: Not on file  . Minutes of Exercise per Session: Not on file  Stress:   . Feeling of Stress : Not on file  Social Connections:   . Frequency of Communication with Friends and Family: Not on file  . Frequency of Social Gatherings with Friends and Family: Not on file  . Attends Religious Services: Not on file  . Active Member of Clubs or Organizations: Not on file  . Attends ClubArchivisttings: Not on file  . Marital Status: Not on file    Family History: Family History  Problem Relation Age of Onset  . Hypertension Mother   . Heart disease Mother   . Hypertension Father     Allergies: No Known  Allergies  Medications Prior to Admission  Medication Sig Dispense Refill Last Dose  . aspirin EC 81 MG tablet Take 1 tablet (81 mg total) by mouth daily. 30 tablet 10   . Blood Pressure Monitoring (BLOOD PRESSURE KIT) DEVI 1 Device by Does not apply route as needed. ICD 10:   O09.90 1 Device 0   . labetalol (NORMODYNE) 200 MG tablet Take 1 tablet (200 mg total) by mouth 2 (two) times daily. 60 tablet 2   . levothyroxine (SYNTHROID) 125 MCG tablet TAKE 1 TABLET(125 MCG) BY MOUTH DAILY BEFORE BREAKFAST 30 tablet 2   . Prenatal Vit-Fe Fumarate-FA (MULTIVITAMIN-PRENATAL) 27-0.8 MG TABS tablet Take 1 tablet by mouth daily at 12 noon.      Review of Systems   All systems reviewed and negative except as stated in HPI  Last menstrual period 09/15/2018, unknown if currently breastfeeding. General appearance: alert, cooperative and  appears stated age Lungs: clear to auscultation bilaterally Heart: regular rate and rhythm Abdomen: soft, non-tender; bowel sounds normal Pelvic: No vaginal lesions Extremities: Homans sign is negative, no sign of DVT DTR's intact Presentation: cephalic Fetal monitoringBaseline: 140 bpm, Variability: Good {> 6 bpm), Accelerations: Reactive and Decelerations: Absent Uterine activityFrequency: Every q2-3 minutes    Prenatal labs: ABO, Rh: O/Positive/-- (08/24 1129) Antibody: Positive, See Final Results (12/02 0947) Rubella: 15.70 (08/24 1129) RPR: Reactive (12/02 0842)  HBsAg: Negative (08/24 1129)  HIV: Non Reactive (12/02 0842)  GBS:  Unknown  1 hr Glucola 81-123-113 Genetic screening  NIPS low risk female Anatomy US: Normal presentation  Prenatal Transfer Tool  Maternal Diabetes: No Genetic Screening: Normal Maternal Ultrasounds/Referrals: Normal Fetal Ultrasounds or other Referrals:  Referred to Materal Fetal Medicine  Maternal Substance Abuse:  No Significant Maternal Medications:  Meds include: Syntroid Other: Labetalol, ASA Significant Maternal Lab Results: None and Other: GBS unknown  No results found for this or any previous visit (from the past 24 hour(s)).  Patient Active Problem List   Diagnosis Date Noted  . Chronic hypertension with superimposed severe preeclampsia 11/09/2019  . UTI in pregnancy 05/29/2019  . Chronic hypertension during pregnancy, antepartum 05/25/2019  . Hypothyroidism in pregnancy 05/25/2019  . Maternal atypical antibody 05/25/2019  . BMI 30s 05/22/2019  . Supervision of high risk pregnancy, antepartum 05/03/2019  . History of pre-eclampsia 05/03/2019  . Obesity in pregnancy 05/03/2019  . Biological false positive RPR test 04/28/2012    Assessment/Plan:  [name] [name] is a 29 y.o. G3P1011 at 59w5dhere for IOL 2/2 chronic HTN with superimposed PEC  #Labor: Risks and benefits of induction were reviewed, including failure of method,  prolonged labor, need for further intervention, risk of cesarean.  Patient and family seem to understand these risks and wish to proceed. Options of cytotec, foley bulb, AROM, and pitocin reviewed, with use of each discussed. Plan to start cytotec. Admission labs pending. #CHTN w/ SIPE: Patient with chronic htn, period of control on labetalol during this pregnancy. Presented to clinic today with severe range BP's 180-190/130-140 and HA's. On admission tonight, normotensive 138/87, non-toxic appearing, asymptomatic. Magnesium infusion protocol started; continue labetalol 200 mg BID. Cr, AST/ALT, and plt WNL. Urine P:C ratio pending. Continue to monitor. #Hypothyroidism: TSH 0.446 08/30/2019. Continue home medication, synthroid 125 mcg daily. #RPR: biologic positive RPR, last check 1:2. T. Pallidum abs negative. Recheck RPR and t.pal abs at admission, pending. #Maternal atypical ab: screen positive at 28 weeks too weak to identify. T&S pending. #Pain: Per patient request #FWB: Cat I, EFW: 3700g #  ID:  GBS unk, PCN ordered #MOF: breast #MOC:NFP #Circ:  n/a  Trinna Post, Medical Student  11/09/2019, 6:46 PM  RESIDENT ATTESTATION OF STUDENT NOTE   I have seen and examined this patient.  I attest that I have reviewed the student note and that the components of the history of the present illness, the physical exam, and the assessment and plan documented were performed by me or were performed in my presence by the student where I verified the documentation and performed (or re-performed) the exam and medical decision making. I verify that the service and findings are accurately documented in the student's note.  Gladys Damme, MD PGY-1, [facility] 11/09/2019 8:42 PM   I saw and evaluated the patient. I agree with the findings and the plan of care as documented in the resident's note. Vertex by exam. cHTN with superimposed severe Pre-E. Mag and Labetalol protocol. IOL with FB and  Cytotec. Anticipate SVD.  Barrington Ellison, MD [facility] Family Medicine Fellow, [facility] for [employer], [place]

## 2019-11-09 NOTE — Progress Notes (Addendum)
   PRENATAL VISIT NOTE  Subjective:  [name] [name] is a 29 y.o. G3P1011 at [redacted]w[redacted]d being seen today for ongoing prenatal care.  She is currently monitored for the following issues for this high-risk pregnancy and has Biological false positive RPR test; Supervision of high risk pregnancy, antepartum; History of pre-eclampsia; Obesity in pregnancy; BMI 30s; Chronic hypertension during pregnancy, antepartum; Hypothyroidism in pregnancy; Maternal atypical antibody; UTI in pregnancy; and Chronic hypertension with superimposed severe preeclampsia on their problem list.  Patient reports headache and this happens daily.  No visual symptoms, no abdominal pain.  Contractions: Irregular. Vag. Bleeding: None.  Movement: Present. Denies leaking of fluid.   The following portions of the patient's history were reviewed and updated as appropriate: allergies, current medications, past family history, past medical history, past social history, past surgical history and problem list.   Objective:   Vitals:   11/09/19 1625 11/09/19 1633  BP: (!) 186/139 (!) 195/142  Pulse: (!) 106   Weight: 219 lb (99.3 kg)     Fetal Status: Fetal Heart Rate (bpm): 147   Movement: Present     General:  Alert, oriented and cooperative. Patient is in no acute distress.  Skin: Skin is warm and dry. No rash noted.   Cardiovascular: Normal heart rate noted  Respiratory: Normal respiratory effort, no problems with respiration noted  Abdomen: Soft, gravid, appropriate for gestational age.  Pain/Pressure: Present     Pelvic: Cervical exam performed Dilation: Fingertip Effacement (%): Thick Station: Ballotable  Extremities: Normal range of motion.  Edema: None  Mental Status: Normal mood and affect. Normal behavior. Normal judgment and thought content.   Assessment and Plan:  Pregnancy: G3P1011 at [redacted]w[redacted]d 1. Chronic hypertension with superimposed severe preeclampsia Needs to deliver.  Patient agrees. [facility] staff informed. Patient to go  to [facility] now.   2. Supervision of high risk pregnancy, antepartum Pelvic cultures done. Will be treated with PCN for prematurity, GBS unknown. - Strep Gp B NAA - GC/Chlamydia probe amp ([place])not at [facility]  Please refer to After Visit Summary for other counseling recommendations.     Future Appointments  Date Time Provider Department Center  11/14/2019  9:00 AM WH-MFC NURSE WH-MFC MFC-US  11/14/2019  9:00 AM WH-MFC [place] 3 WH-MFCUS MFC-US  11/16/2019 10:15 AM Levie Heritage, DO WOC-[facility] WOC  11/23/2019  9:15 AM Federico Flake, MD WOC-W[facility]A WOC  11/29/2019  9:15 AM [place] Bing, MD [facility]    Jaynie Collins, MD

## 2019-11-09 NOTE — Anesthesia Procedure Notes (Addendum)
Epidural Patient location during procedure: OB Start time: 11/09/2019 11:20 PM End time: 11/09/2019 11:32 PM  Staffing Anesthesiologist: Trevor Iha, MD Performed: anesthesiologist   Preanesthetic Checklist Completed: patient identified, IV checked, site marked, risks and benefits discussed, surgical consent, monitors and equipment checked, pre-op evaluation and timeout performed  Epidural Patient position: sitting Prep: DuraPrep and site prepped and draped Patient monitoring: continuous pulse ox and blood pressure Approach: midline Location: L3-L4 Injection technique: LOR air  Needle:  Needle type: Tuohy  Needle gauge: 17 G Needle length: 9 cm and 9 Needle insertion depth: 7 cm Catheter type: closed end flexible Catheter size: 19 Gauge Catheter at skin depth: 12 cm Test dose: negative  Assessment Events: blood not aspirated, injection not painful, no injection resistance, no paresthesia and negative IV test  Additional Notes Patient identified. Risks/Benefits/Options discussed with patient including but not limited to bleeding, infection, nerve damage, paralysis, failed block, incomplete pain control, headache, blood pressure changes, nausea, vomiting, reactions to medication both or allergic, itching and postpartum back pain. Confirmed with bedside nurse the patient's most recent platelet count. Confirmed with patient that they are not currently taking any anticoagulation, have any bleeding history or any family history of bleeding disorders. Patient expressed understanding and wished to proceed. All questions were answered. Sterile technique was used throughout the entire procedure. Please see nursing notes for vital signs. Test dose was given through epidural needle and negative prior to continuing to dose epidural or start infusion. Warning signs of high block given to the patient including shortness of breath, tingling/numbness in hands, complete motor block, or any  concerning symptoms with instructions to call for help. Patient was given instructions on fall risk and not to get out of bed. All questions and concerns addressed with instructions to call with any issues. 1 Attempt (S) . Patient tolerated procedure well.

## 2019-11-09 NOTE — Anesthesia Preprocedure Evaluation (Addendum)
Anesthesia Evaluation  Patient identified by MRN, date of birth, ID band Patient awake    Reviewed: Allergy & Precautions, NPO status , Patient's Chart, lab work & pertinent test results  Airway Mallampati: II  TM Distance: >3 FB Neck ROM: Full    Dental no notable dental hx. (+) Teeth Intact   Pulmonary neg pulmonary ROS,    Pulmonary exam normal breath sounds clear to auscultation       Cardiovascular hypertension, Pt. on medications and Pt. on home beta blockers Normal cardiovascular exam Rhythm:Regular Rate:Normal  Chronic HTn w Superimposed Preclampsia on Mg++   Neuro/Psych negative neurological ROS     GI/Hepatic negative GI ROS, Neg liver ROS,   Endo/Other    Renal/GU negative Renal ROS     Musculoskeletal   Abdominal (+) + obese,   Peds  Hematology negative hematology ROS (+) Hgb 11.9 Plt 172   Anesthesia Other Findings   Reproductive/Obstetrics (+) Pregnancy                             Anesthesia Physical Anesthesia Plan  ASA: III  Anesthesia Plan: Epidural   Post-op Pain Management:    Induction:   PONV Risk Score and Plan:   Airway Management Planned:   Additional Equipment: None  Intra-op Plan:   Post-operative Plan:   Informed Consent: I have reviewed the patients History and Physical, chart, labs and discussed the procedure including the risks, benefits and alternatives for the proposed anesthesia with the patient or authorized representative who has indicated his/her understanding and acceptance.       Plan Discussed with:   Anesthesia Plan Comments: (36 5/7 wk G3P1 w chronic Htn and PIH for LEA)        Anesthesia Quick Evaluation

## 2019-11-10 ENCOUNTER — Encounter (HOSPITAL_COMMUNITY): Payer: Self-pay | Admitting: Obstetrics & Gynecology

## 2019-11-10 DIAGNOSIS — Z3A36 36 weeks gestation of pregnancy: Secondary | ICD-10-CM

## 2019-11-10 DIAGNOSIS — O114 Pre-existing hypertension with pre-eclampsia, complicating childbirth: Secondary | ICD-10-CM

## 2019-11-10 DIAGNOSIS — O09299 Supervision of pregnancy with other poor reproductive or obstetric history, unspecified trimester: Secondary | ICD-10-CM

## 2019-11-10 LAB — CBC
HCT: 34.3 % — ABNORMAL LOW (ref 36.0–46.0)
Hemoglobin: 10.9 g/dL — ABNORMAL LOW (ref 12.0–15.0)
MCH: 29 pg (ref 26.0–34.0)
MCHC: 31.8 g/dL (ref 30.0–36.0)
MCV: 91.2 fL (ref 80.0–100.0)
Platelets: 165 10*3/uL (ref 150–400)
RBC: 3.76 MIL/uL — ABNORMAL LOW (ref 3.87–5.11)
RDW: 14.1 % (ref 11.5–15.5)
WBC: 18.3 10*3/uL — ABNORMAL HIGH (ref 4.0–10.5)
nRBC: 0 % (ref 0.0–0.2)

## 2019-11-10 LAB — RPR
RPR Ser Ql: REACTIVE — AB
RPR Titer: 1:2 {titer}

## 2019-11-10 MED ORDER — DIPHENHYDRAMINE HCL 25 MG PO CAPS: 25.0000 mg | ORAL_CAPSULE | Freq: Four times a day (QID) | ORAL | Status: DC | PRN

## 2019-11-10 MED ORDER — MAGNESIUM SULFATE 40 GM/1000ML IV SOLN
2.0000 g/h | INTRAVENOUS | Status: AC
  Administered 2019-11-11: 2 g/h via INTRAVENOUS
  Filled 2019-11-10: qty 1000

## 2019-11-10 MED ORDER — SIMETHICONE 80 MG PO CHEW: 80.0000 mg | CHEWABLE_TABLET | ORAL | Status: DC | PRN

## 2019-11-10 MED ORDER — IBUPROFEN 600 MG PO TABS
600.0000 mg | ORAL_TABLET | Freq: Three times a day (TID) | ORAL | Status: DC | PRN
  Administered 2019-11-11: 600 mg via ORAL
  Filled 2019-11-10: qty 1

## 2019-11-10 MED ORDER — PRENATAL MULTIVITAMIN CH
1.0000 | ORAL_TABLET | Freq: Every day | ORAL | Status: DC
  Administered 2019-11-11: 1 via ORAL

## 2019-11-10 MED ORDER — MISOPROSTOL 50MCG HALF TABLET
50.0000 ug | ORAL_TABLET | Freq: Once | ORAL | Status: AC
  Administered 2019-11-10: 50 ug via BUCCAL
  Filled 2019-11-10: qty 1

## 2019-11-10 MED ORDER — TETANUS-DIPHTH-ACELL PERTUSSIS 5-2.5-18.5 LF-MCG/0.5 IM SUSP: 0.5000 mL | Freq: Once | INTRAMUSCULAR | Status: DC

## 2019-11-10 MED ORDER — BENZOCAINE-MENTHOL 20-0.5 % EX AERO: 1.0000 "application " | INHALATION_SPRAY | CUTANEOUS | Status: DC | PRN

## 2019-11-10 MED ORDER — POLYETHYLENE GLYCOL 3350 17 G PO PACK
17.0000 g | PACK | Freq: Two times a day (BID) | ORAL | Status: DC
  Administered 2019-11-11 – 2019-11-12 (×3): 17 g via ORAL
  Filled 2019-11-10 (×3): qty 1

## 2019-11-10 MED ORDER — WITCH HAZEL-GLYCERIN EX PADS: 1.0000 "application " | MEDICATED_PAD | CUTANEOUS | Status: DC | PRN

## 2019-11-10 MED ORDER — MEASLES, MUMPS & RUBELLA VAC IJ SOLR: 0.5000 mL | Freq: Once | INTRAMUSCULAR | Status: DC

## 2019-11-10 MED ORDER — ONDANSETRON HCL 4 MG PO TABS: 4.0000 mg | ORAL_TABLET | ORAL | Status: DC | PRN

## 2019-11-10 MED ORDER — SENNOSIDES-DOCUSATE SODIUM 8.6-50 MG PO TABS
2.0000 | ORAL_TABLET | ORAL | Status: DC
  Administered 2019-11-11 – 2019-11-12 (×2): 2 via ORAL
  Filled 2019-11-10 (×2): qty 2

## 2019-11-10 MED ORDER — ONDANSETRON HCL 4 MG/2ML IJ SOLN: 4.0000 mg | INTRAMUSCULAR | Status: DC | PRN

## 2019-11-10 MED ORDER — ACETAMINOPHEN 325 MG PO TABS: 650.0000 mg | ORAL_TABLET | Freq: Four times a day (QID) | ORAL | Status: DC | PRN

## 2019-11-10 MED ORDER — COCONUT OIL OIL: 1.0000 "application " | TOPICAL_OIL | Status: DC | PRN

## 2019-11-10 MED ORDER — MAGNESIUM HYDROXIDE 400 MG/5ML PO SUSP
5.0000 mL | Freq: Every day | ORAL | Status: DC
  Administered 2019-11-11 – 2019-11-12 (×2): 5 mL via ORAL
  Filled 2019-11-10 (×2): qty 30

## 2019-11-10 MED ORDER — DIBUCAINE (PERIANAL) 1 % EX OINT: 1.0000 "application " | TOPICAL_OINTMENT | CUTANEOUS | Status: DC | PRN

## 2019-11-10 MED ORDER — OXYCODONE HCL 5 MG PO TABS: 5.0000 mg | ORAL_TABLET | ORAL | Status: DC | PRN

## 2019-11-10 NOTE — H&P (Deleted)
[name] [name] is a 29 y.o. G3P1011 at [redacted]w[redacted]d admitted for IOL 2/2 cHTN with superimposed Pre-E w/SF (HA and BP)   Subjective: Denies pain. Is not able to feel contractions after epidural.   Objective: BP 126/76   Pulse 76   Temp 97.7 F (36.5 C) (Oral)   Resp 16   Ht 5\' 6"  (1.676 m)   Wt 99.6 kg   LMP 09/15/2018 (LMP Unknown)   SpO2 99%   BMI 35.44 kg/m  Total I/O In: 1232.3 [P.O.:200; I.V.:982.3; IV Piggyback:50] Out: 900 [Urine:900]  FHT: Cat 1  FHR: ~120 bpm, variability: moderate,  accelerations:  Abscent,  decelerations:  Absent UC:  regular, every 3 minutes  SVE:   Dilation: 5.5 Effacement (%): 80 Station: -1 Exam by:: [email or web address] RNC   Pitocin @ 40 mu/min  Labs: Lab Results  Component Value Date   WBC 10.0 11/09/2019   HGB 11.9 (L) 11/09/2019   HCT 35.7 (L) 11/09/2019   MCV 86.9 11/09/2019   PLT 174 11/09/2019    Assessment / Plan: Induction of labor due to Chronic HTN, imposed by Pre-E,  progressing well on pitocin  Labor: Progressing on Pitocin, will continue to increase then AROM Fetal Wellbeing:  Category I Pain Control:  Epidural Pre-eclampsia: BP under control.   I/D:  GBS None- PCN Anticipated MOD:  Vaginal, C-Secction if needed  01/07/2020 DO OB Fellow, Faculty Practice 11/10/2019, 2:52 PM

## 2019-11-10 NOTE — Progress Notes (Signed)
[name] [name] is a 29 y.o. G3P1011 at [redacted]w[redacted]d admitted for IOL 2/2 cHTN with superimposed Pre-E w/SF (HA and BP).  Subjective: Headache much improved, now rates as 1/10. Feeling baby move. Denies SOB< RUQ pain, or vision changes. No recent severe range blood pressures.   Objective: BP 120/69 (BP Location: Right Arm)   Pulse 77   Temp 97.8 F (36.6 C) (Oral)   Resp 16   Ht 5\' 6"  (1.676 m)   Wt 99.6 kg   LMP 09/15/2018 (LMP Unknown)   SpO2 99%   BMI 35.44 kg/m  Total I/O In: 422.4 [I.V.:422.4] Out: 300 [Urine:300]  FHT:  FHR: 120-125 bpm, variability: moderate,  accelerations:  Present,  decelerations:  Present occasional variable v 1 early decel UC:   regular, every 2-3 minutes  SVE:   Dilation: 4 Effacement (%): 70 Station: -3 Exam by:: Dr. [email or web address]  Pitocin @ 12 mu/min  Labs: Lab Results  Component Value Date   WBC 10.0 11/09/2019   HGB 11.9 (L) 11/09/2019   HCT 35.7 (L) 11/09/2019   MCV 86.9 11/09/2019   PLT 174 11/09/2019    Assessment / Plan: [name] [name] is a 29 y.o. G3P1011 at [redacted]w[redacted]d admitted forIOL 2/2cHTN w/ SIPE w/ SF (BP, HA).   #Labor: S/p FB, cyto x2. On pitocin. AROM with clear fluid at this check. IUPC placed. Continue to titrate pitocin. #Fetal Wellbeing:  Category I #Pain Control: Comfortable with epidural #ID: GBS None - PCN #Anticipated MOD: vaginal, CS as appropriate   #CHTN w/ SIPE w/SF (BP and HA):On Mag. HA improved to 1/10. No vision changes or RUQ pain. No severe range blood pressures, most recent 120/69. On labetalol 200 mg BID. Continue to monitor. #Hypothyroidism: Continue synthroid 125 mcg daily. #RPR: biologic positive RPR, last check 1:2. T. Pallidum abs negative. Recheck RPR and t.pal abs at admission, pending.  [redacted]w[redacted]d DO OB Fellow, [facility] 11/10/2019, 10:43 AM

## 2019-11-10 NOTE — Progress Notes (Signed)
LABOR PROGRESS NOTE  [name] [name] is a 29 y.o. G3P1011 at [redacted]w[redacted]d admitted for IOL 2/2 cHTN w/ SIPE w/ SF (BP, HA).   Subjective: Patient with much relief from epidural. No complaints at this time.  Objective: BP 94/60   Pulse 77   Temp 98 F (36.7 C) (Oral)   Resp 16   Ht 5\' 6"  (1.676 m)   Wt 99.6 kg   LMP 09/15/2018 (LMP Unknown)   SpO2 95%   BMI 35.44 kg/m    Dilation: 3.5 Effacement (%): 60 Station: -3 Presentation: Vertex Exam by:: Dr. [email or web address] Fetal monitoring: Baseline: 140 bpm, Variability: Good {> 6 bpm), Accelerations: Reactive, and Decelerations: Absent Uterine activity: Frequency: Every 2-3 minutes, Duration: 30-90 seconds, and Intensity: mild  Labs: Lab Results  Component Value Date   WBC 10.0 11/09/2019   HGB 11.9 (L) 11/09/2019   HCT 35.7 (L) 11/09/2019   MCV 86.9 11/09/2019   PLT 174 11/09/2019    Patient Active Problem List   Diagnosis Date Noted  . Chronic hypertension with superimposed severe preeclampsia 11/09/2019  . UTI in pregnancy 05/29/2019  . Chronic hypertension during pregnancy, antepartum 05/25/2019  . Hypothyroidism in pregnancy 05/25/2019  . Maternal atypical antibody 05/25/2019  . BMI 30s 05/22/2019  . Supervision of high risk pregnancy, antepartum 05/03/2019  . History of pre-eclampsia 05/03/2019  . Obesity in pregnancy 05/03/2019  . Biological false positive RPR test 04/28/2012    Assessment / Plan: [name] [name] is a 29 y.o. G3P1011 at [redacted]w[redacted]d admitted for IOL 2/2 cHTN w/ SIPE w/ SF (BP, HA).   #Labor: Progressing normally. S/p FB, now out. Cytotec started @ 2112. Will consider pitocin after next check. #Fetal Wellbeing:  Category I #Pain Control: Epidural #ID: GBS None - PCN #Anticipated MOD: NSVD   #CHTN w/ SIPE: Magnesium infusion protocol started; continue labetalol 200 mg BID. Continue to monitor. #Hypothyroidism: TSH 0.446 08/30/2019. Continue home medication, synthroid 125 mcg daily. #RPR: biologic positive RPR, last  check 1:2. T. Pallidum abs negative. Recheck RPR and t.pal abs at admission, pending. #Maternal atypical ab: screen positive at 28 weeks too weak to identify. T&S now shows NO CLINICALLY SIGNIFICANT ANTIBODY DETECTED.  14/10/2018, DO, PGY-1 Family Medicine Resident, [facility] Faculty Teaching Service  11/10/2019, 1:43 AM

## 2019-11-10 NOTE — Discharge Summary (Signed)
Postpartum Discharge Summary     Patient Name: [name] [name] DOB: [birth year] MRN: [id number]  Date of admission: 11/09/2019 Delivering Provider: Chauncey Mann   Date of discharge: 11/12/2019  Admitting diagnosis: Chronic hypertension with superimposed preeclampsia [O11.9] Intrauterine pregnancy: [redacted]w[redacted]d    Secondary diagnosis:  Active Problems:   Biological false positive RPR test   History of pre-eclampsia   Obesity in pregnancy   Chronic hypertension during pregnancy, antepartum   Chronic hypertension with superimposed severe preeclampsia   Type 3b perineal laceration during delivery   Hx of maternal fourth degree perineal laceration, currently pregnant  Additional problems: None     Discharge diagnosis: Preterm Pregnancy Delivered and Preeclampsia (severe)                                                                                                Post partum procedures:None  Augmentation: AROM, Pitocin, Cytotec and Foley Balloon  Complications: None  Hospital course:  Induction of Labor With Vaginal Delivery   29y.o. yo G3P1011 at 349w6das admitted to the hospital 11/09/2019 for induction of labor.  Indication for induction: Preeclampsia.  Patient had an uncomplicated labor course as follows: Initial SVE: closed/thick/high. Patient received Cytotec, FB, Pitocin and AROM. Received epidural. She then progressed to complete.  Membrane Rupture Time/Date: 10:36 AM ,11/10/2019   Intrapartum Procedures: Episiotomy: None [1]                                         Lacerations:  3rd degree [4];Perineal [11]  Patient had delivery of a Viable infant.  Information for the patient's newborn:  ByHinley, Brimage0[[id number]]    11/10/2019  Details of delivery can be found in separate delivery note. Patient with 3b tear. Bowel regimen initiated and post-partum appt requested. Patient had a routine postpartum course. Magnesium continued for 24 hours post-partum. BP's monitored and WNL  without anti-hypertensive. Patient is discharged home 11/12/19. Delivery time: 8:46 PM    Magnesium Sulfate received: Yes BMZ received: No Rhophylac:No MMR:No Transfusion:No  Physical exam  Vitals:   11/11/19 1957 11/12/19 0006 11/12/19 0343 11/12/19 0811  BP: 128/72 (!) 112/55 115/72 118/69  Pulse: 76 81 73 71  Resp: _0 Temp: 98.4 F (36.9 C) 97.8 F (36.6 C) 98.2 F (36.8 C) 98.7 F (37.1 C)  TempSrc: Axillary Oral Oral Oral  SpO2: 100% 100% 100% 99%  Weight:      Height:       General: alert, cooperative and no distress Lochia: appropriate Uterine Fundus: firm Incision: N/A DVT Evaluation: No evidence of DVT seen on physical exam. Labs: Lab Results  Component Value Date   WBC 18.3 (H) 11/10/2019   HGB 10.9 (L) 11/10/2019   HCT 34.3 (L) 11/10/2019   MCV 91.2 11/10/2019   PLT 165 11/10/2019   CMP Latest Ref Rng & Units 11/09/2019  Glucose 70 - 99 mg/dL 92  BUN 6 - 20 mg/dL 6  Creatinine 0.44 -  1.00 mg/dL 0.54  Sodium 135 - 145 mmol/L 136  Potassium 3.5 - 5.1 mmol/L 3.5  Chloride 98 - 111 mmol/L 105  CO2 22 - 32 mmol/L 18(L)  Calcium 8.9 - 10.3 mg/dL 9.3  Total Protein 6.5 - 8.1 g/dL 6.8  Total Bilirubin 0.3 - 1.2 mg/dL 0.8  Alkaline Phos 38 - 126 U/L 60  AST 15 - 41 U/L 15  ALT 0 - 44 U/L 10   Edinburgh Score: Edinburgh Postnatal Depression Scale Screening Tool 11/11/2019  I have been able to laugh and see the funny side of things. 0  I have looked forward with enjoyment to things. 0  I have blamed myself unnecessarily when things went wrong. 0  I have been anxious or worried for no good reason. 0  I have felt scared or panicky for no good reason. 0  Things have been getting on top of me. 0  I have been so unhappy that I have had difficulty sleeping. 0  I have felt sad or miserable. 0  I have been so unhappy that I have been crying. 0  The thought of harming myself has occurred to me. 0  Edinburgh Postnatal Depression Scale Total 0     Discharge instruction: per After Visit Summary and "Baby and Me Booklet".  After visit meds:  Allergies as of 11/12/2019   No Known Allergies     Medication List    STOP taking these medications   aspirin EC 81 MG tablet   labetalol 200 MG tablet Commonly known as: NORMODYNE     TAKE these medications   acetaminophen 325 MG tablet Commonly known as: Tylenol Take 2 tablets (650 mg total) by mouth every 6 (six) hours as needed (for pain scale < 4).   Blood Pressure Kit Devi 1 Device by Does not apply route as needed. ICD 10:   O09.90   ibuprofen 600 MG tablet Commonly known as: ADVIL Take 1 tablet (600 mg total) by mouth every 8 (eight) hours as needed for mild pain.   levothyroxine 125 MCG tablet Commonly known as: SYNTHROID TAKE 1 TABLET(125 MCG) BY MOUTH DAILY BEFORE BREAKFAST What changed: See the new instructions.   oxyCODONE 5 MG immediate release tablet Commonly known as: Oxy IR/ROXICODONE Take 1 tablet (5 mg total) by mouth every 4 (four) hours as needed for moderate pain.   polyethylene glycol 17 g packet Commonly known as: MIRALAX / GLYCOLAX Take 17 g by mouth 2 (two) times daily.   prenatal multivitamin Tabs tablet Take 1 tablet by mouth daily at 12 noon.   senna-docusate 8.6-50 MG tablet Commonly known as: Senokot-S Take 2 tablets by mouth daily. Start taking on: November 13, 2019       Diet: routine diet  Activity: Advance as tolerated. Pelvic rest for 6 weeks.   Outpatient follow up:4 weeks Follow up Appt: Future Appointments  Date Time Provider Hall  11/14/2019  9:00 AM [place] [facility]  11/14/2019  9:00 AM [facility] [place] 3 [facility]  11/16/2019 10:15 AM Truett Mainland, DO [facility] [place]  11/23/2019  9:15 AM Caren Macadam, MD [place]  11/29/2019  9:15 AM Aletha Halim, MD [facility]   Follow up Visit:    Please schedule this patient for Postpartum visit in: 4 weeks with the following provider:  MD Virtual High risk pregnancy complicated by: HTN Delivery mode:  SVD Anticipated Birth Control:  NFP PP Procedures needed: needs BP check and 3rd degree tear  check in 1 week in-office  Schedule Integrated BH visit: no   Newborn Data: Live born female  Birth Weight: 3096g  APGAR: 47, 9  Newborn Delivery   Birth date/time: 11/10/2019 20:46:00 Delivery type: Vaginal, Spontaneous      Baby Feeding: Breast Disposition:home with mother   11/12/2019 Chauncey Mann, MD

## 2019-11-10 NOTE — Progress Notes (Signed)
LABOR PROGRESS NOTE  [name] [name] is a 29 y.o. G3P1011 at [redacted]w[redacted]d admitted for IOL 2/2 cHTN w/ SIPE w/ SF (BP, HA).   Subjective: Patient states that she is in no pain, is feeling mild contractions.  She has no complaints at this time.  She states that she spent some time on the peanut ball and has had a little nausea however this is better now.  Objective: BP 117/71   Pulse 74   Temp 98 F (36.7 C) (Oral)   Resp 15   Ht 5\' 6"  (1.676 m)   Wt 99.6 kg   LMP 09/15/2018 (LMP Unknown)   SpO2 100%   BMI 35.44 kg/m    Dilation: 4 Effacement (%): 70 Station: -3 Presentation: Vertex Exam by:: Dr. [email or web address] Fetal monitoring: Baseline: 130 bpm, Variability: Good {> 6 bpm), Accelerations: Reactive, and Decelerations: Absent Uterine activity: Frequency: Every 2-5 minutes, Duration: 50-100 seconds, and Intensity: mild  Labs: Lab Results  Component Value Date   WBC 10.0 11/09/2019   HGB 11.9 (L) 11/09/2019   HCT 35.7 (L) 11/09/2019   MCV 86.9 11/09/2019   PLT 174 11/09/2019    Patient Active Problem List   Diagnosis Date Noted  . Chronic hypertension with superimposed severe preeclampsia 11/09/2019  . UTI in pregnancy 05/29/2019  . Chronic hypertension during pregnancy, antepartum 05/25/2019  . Hypothyroidism in pregnancy 05/25/2019  . Maternal atypical antibody 05/25/2019  . BMI 30s 05/22/2019  . Supervision of high risk pregnancy, antepartum 05/03/2019  . History of pre-eclampsia 05/03/2019  . Obesity in pregnancy 05/03/2019  . Biological false positive RPR test 04/28/2012    Assessment / Plan: [name] [name] is a 29 y.o. G3P1011 at [redacted]w[redacted]d admitted for IOL 2/2 cHTN w/ SIPE w/ SF (BP, HA).   #Labor: Progressing normally. S/p FB, now out. Cytotec started @ 2112. Starting pitocin 0600. #Fetal Wellbeing:  Category I #Pain Control: Epidural #ID: GBS None - PCN #Anticipated MOD: NSVD   #CHTN w/ SIPE: Magnesium infusion protocol started; continue labetalol 200 mg BID. Continue to  monitor. #Hypothyroidism: TSH 0.446 08/30/2019. Continue home medication, synthroid 125 mcg daily. #RPR: biologic positive RPR, last check 1:2. T. Pallidum abs negative. Recheck RPR and t.pal abs at admission, pending. #Maternal atypical ab: screen positive at 28 weeks too weak to identify. T&S now shows NO CLINICALLY SIGNIFICANT ANTIBODY DETECTED.  14/10/2018, DO, PGY-1 Family Medicine Resident, [facility] Faculty Teaching Service  11/10/2019, 5:59 AM

## 2019-11-10 NOTE — Progress Notes (Addendum)
[name] [name] is a 29 y.o. G3P1011 at 107w6d admitted for IOL 2/2 cHTN with superimposed Pre-E w/SF (HA and BP)   Subjective: Denies pain. Is not able to feel contractions after epidural.   Objective: BP 126/76   Pulse 76   Temp 97.7 F (36.5 C) (Oral)   Resp 16   Ht 5\' 6"  (1.676 m)   Wt 99.6 kg   LMP 09/15/2018 (LMP Unknown)   SpO2 99%   BMI 35.44 kg/m  Total I/O In: 1232.3 [P.O.:200; I.V.:982.3; IV Piggyback:50] Out: 900 [Urine:900]  FHT: Cat 1  FHR: ~120 bpm, variability: moderate,  accelerations:  Absent,  decelerations:  Absent UC:  regular, every 3 minutes  SVE:   Dilation: 5.5 Effacement (%): 80 Station: -1 Exam by:: [email or web address] RNC   Pitocin @ 18 mu/min  Labs: Lab Results  Component Value Date   WBC 10.0 11/09/2019   HGB 11.9 (L) 11/09/2019   HCT 35.7 (L) 11/09/2019   MCV 86.9 11/09/2019   PLT 174 11/09/2019    Assessment / Plan: [name] [name] is a 29 y.o. G3P1011 at [redacted]w[redacted]d admitted forIOL 2/2cHTN w/ SIPE w/ SF (BP, HA).   #Labor: S/p FB, cyto x2, s/p AROM.On pitocin, now at 18. IUPC was removed due to patient discomfort. She is making adequate cervical change at this time on Pitocin. Continue to titrate pitocin as needed, if appropriate replace IUPC. #Fetal Wellbeing: Category I #Pain Control: Comfortable with epidural #ID: GBS None - PCN #Anticipated MOD: vaginal, CS as appropriate #CHTN w/ SIPE w/SF (BP and HA):On Mag. HA improved. No vision changes or RUQ pain. No severe range blood pressures, most recent 105/75. On labetalol 200 mg BID. Continue to monitor. #Hypothyroidism: Continue synthroid 125 mcg daily. #RPR: biologic positive RPR, titre 1:2. Most likely false positive.  [redacted]w[redacted]d DO OB Fellow, Faculty Practice 11/10/2019, 2:52 PM  GME ATTESTATION:  I saw and evaluated the patient. I agree with the findings and the plan of care as documented in the student's note.  01/08/2020, DO OB Fellow, Faculty [facility],  Center for [facility] 11/10/2019 4:08 PM

## 2019-11-11 ENCOUNTER — Encounter (HOSPITAL_COMMUNITY): Payer: Self-pay | Admitting: Obstetrics & Gynecology

## 2019-11-11 LAB — STREP GP B NAA: Strep Gp B NAA: NEGATIVE

## 2019-11-11 MED ORDER — LACTATED RINGERS IV SOLN: INTRAVENOUS | Status: DC

## 2019-11-11 NOTE — Progress Notes (Signed)
Post Partum Day 1 Subjective: Patient reports feeling well. She denies feeling dizzy or lightheaded. She is tolerating a regular diet  Objective: Blood pressure 126/71, pulse 80, temperature 97.7 F (36.5 C), temperature source Oral, resp. rate 17, height 5\' 6"  (1.676 m), weight 99.6 kg, last menstrual period 09/15/2018, SpO2 99 %, unknown if currently breastfeeding.  Physical Exam:  General: alert, cooperative and no distress Lochia: appropriate Uterine Fundus: firm DVT Evaluation: No evidence of DVT seen on physical exam.  Recent Labs    11/09/19 2014 11/10/19 2226  HGB 11.9* 10.9*  HCT 35.7* 34.3*    Assessment/Plan: 29 yo P1112 s/p SVD with severe preeclampsia - Patient to complete magnesium sulfate for a total of 24 hours post deliver - BP normal and we will continue to monitor - Discontinue foley catheter - Continue routine postpartum care   LOS: 2 days   Sevin Farone 11/11/2019, 9:06 AM

## 2019-11-11 NOTE — Lactation Note (Signed)
This note was copied from a baby's chart. Lactation Consultation Note  Patient Name: [name] [name] MBWGY'K Date: 11/11/2019 Reason for consult: Initial assessment;Hyperbilirubinemia;Late-preterm 34-36.6wks  LC in to visit with P2 Mom of [redacted]w[redacted]d infant at 54 hrs old.  Baby at   1 % weight loss.  Baby >6 lbs and has been to the breast 6 times already with latch scores of 9.  Mom reports baby opens and latches deeply, and denies feeling any pinching.    Baby started on phototherapy this afternoon.  Mom holding baby against her breast as baby had just finished breastfeeding.  Mom aware of breast massage and hand expression, and how she can spoon feed baby extra colostrum.    LPTI guideline handout shared.  Mom aware of importance of avoiding  over stimulating baby.  Talked about normal LPTI sleepiness in 2nd day of life, as well as sleepiness along with jaundice.  Offered to set up a DEBP to add extra support for her milk supply.  Mom declined currently due to baby breastfeeding so well.  Mom to let her RN know if baby starts getting sleepy and not feeding as well, and the need to start pumping.    Mom's first baby was 37 weeks and was under phototherapy as well.  Mom breastfed her for 1 year.    Lactation brochure left in room.  Mom aware of IP and OP lactation support available to her.  Encouraged her to call prn for assistance.  Interventions Interventions: Breast feeding basics reviewed;Skin to skin;Breast massage;Hand express   Consult Status Consult Status: Follow-up Date: 11/12/19 Follow-up type: In-patient    [name] [name] 11/11/2019, 2:09 PM

## 2019-11-11 NOTE — Progress Notes (Signed)
Pt has removed her urine hat from toilet on multiple occasion. She has been reminded that the hat is required to keep and accurate track of her output. Therefore, only occurrences have only been marked. Carmelina Dane RN

## 2019-11-11 NOTE — Anesthesia Postprocedure Evaluation (Signed)
Anesthesia Post Note  Patient: [name] [name]  Procedure(s) Performed: AN AD HOC LABOR EPIDURAL     Patient location during evaluation: OB High Risk Anesthesia Type: Epidural Level of consciousness: awake and alert Pain management: pain level controlled Vital Signs Assessment: post-procedure vital signs reviewed and stable Respiratory status: spontaneous breathing Cardiovascular status: stable Postop Assessment: no headache, epidural receding, patient able to bend at knees and no backache Anesthetic complications: no Comments: Remains on Magnesium for BP but reports numbness in feet and legs has resolved and is able to move them well.    Last Vitals:  Vitals:   11/11/19 0300 11/11/19 0352  BP:  126/71  Pulse:  80  Resp: 17 17  Temp:  36.5 C  SpO2:      Last Pain:  Vitals:   11/11/19 0352  TempSrc: Oral  PainSc: 0-No pain   Pain Goal: Patients Stated Pain Goal: 4 (11/10/19 2325)                 Edison Pace

## 2019-11-12 LAB — TYPE AND SCREEN
ABO/RH(D): O POS
Antibody Screen: POSITIVE
Unit division: 0
Unit division: 0

## 2019-11-12 LAB — BPAM RBC
Blood Product Expiration Date: 202103132359
Blood Product Expiration Date: 202103132359
Unit Type and Rh: 5100
Unit Type and Rh: 5100

## 2019-11-12 MED ORDER — OXYCODONE HCL 5 MG PO TABS: 5.0000 mg | ORAL_TABLET | ORAL | 0 refills | Status: AC | PRN

## 2019-11-12 MED ORDER — IBUPROFEN 600 MG PO TABS: 600.0000 mg | ORAL_TABLET | Freq: Three times a day (TID) | ORAL | 0 refills | Status: AC | PRN

## 2019-11-12 MED ORDER — SENNOSIDES-DOCUSATE SODIUM 8.6-50 MG PO TABS: 2.0000 | ORAL_TABLET | ORAL | 0 refills | Status: AC

## 2019-11-12 MED ORDER — POLYETHYLENE GLYCOL 3350 17 G PO PACK: 17.0000 g | PACK | Freq: Two times a day (BID) | ORAL | 0 refills | Status: AC

## 2019-11-12 MED ORDER — ACETAMINOPHEN 325 MG PO TABS: 650.0000 mg | ORAL_TABLET | Freq: Four times a day (QID) | ORAL | 0 refills | Status: AC | PRN

## 2019-11-12 NOTE — Discharge Instructions (Signed)
Postpartum Care After Vaginal Delivery This sheet gives you information about how to care for yourself from the time you deliver your baby to up to 6-12 weeks after delivery (postpartum period). Your health care provider may also give you more specific instructions. If you have problems or questions, contact your health care provider. Follow these instructions at home: Vaginal bleeding  It is normal to have vaginal bleeding (lochia) after delivery. Wear a sanitary pad for vaginal bleeding and discharge. ? During the first week after delivery, the amount and appearance of lochia is often similar to a menstrual period. ? Over the next few weeks, it will gradually decrease to a dry, yellow-brown discharge. ? For most women, lochia stops completely by 4-6 weeks after delivery. Vaginal bleeding can vary from woman to woman.  Change your sanitary pads frequently. Watch for any changes in your flow, such as: ? A sudden increase in volume. ? A change in color. ? Large blood clots.  If you pass a blood clot from your vagina, save it and call your health care provider to discuss. Do not flush blood clots down the toilet before talking with your health care provider.  Do not use tampons or douches until your health care provider says this is safe.  If you are not breastfeeding, your period should return 6-8 weeks after delivery. If you are feeding your child breast milk only (exclusive breastfeeding), your period may not return until you stop breastfeeding. Perineal care  Keep the area between the vagina and the anus (perineum) clean and dry as told by your health care provider. Use medicated pads and pain-relieving sprays and creams as directed.  If you had a cut in the perineum (episiotomy) or a tear in the vagina, check the area for signs of infection until you are healed. Check for: ? More redness, swelling, or pain. ? Fluid or blood coming from the cut or tear. ? Warmth. ? Pus or a bad  smell.  You may be given a squirt bottle to use instead of wiping to clean the perineum area after you go to the bathroom. As you start healing, you may use the squirt bottle before wiping yourself. Make sure to wipe gently.  To relieve pain caused by an episiotomy, a tear in the vagina, or swollen veins in the anus (hemorrhoids), try taking a warm sitz bath 2-3 times a day. A sitz bath is a warm water bath that is taken while you are sitting down. The water should only come up to your hips and should cover your buttocks. Breast care  Within the first few days after delivery, your breasts may feel heavy, full, and uncomfortable (breast engorgement). Milk may also leak from your breasts. Your health care provider can suggest ways to help relieve the discomfort. Breast engorgement should go away within a few days.  If you are breastfeeding: ? Wear a bra that supports your breasts and fits you well. ? Keep your nipples clean and dry. Apply creams and ointments as told by your health care provider. ? You may need to use breast pads to absorb milk that leaks from your breasts. ? You may have uterine contractions every time you breastfeed for up to several weeks after delivery. Uterine contractions help your uterus return to its normal size. ? If you have any problems with breastfeeding, work with your health care provider or lactation consultant.  If you are not breastfeeding: ? Avoid touching your breasts a lot. Doing this can make   your breasts produce more milk. ? Wear a good-fitting bra and use cold packs to help with swelling. ? Do not squeeze out (express) milk. This causes you to make more milk. Intimacy and sexuality  Ask your health care provider when you can engage in sexual activity. This may depend on: ? Your risk of infection. ? How fast you are healing. ? Your comfort and desire to engage in sexual activity.  You are able to get pregnant after delivery, even if you have not had  your period. If desired, talk with your health care provider about methods of birth control (contraception). Medicines  Take over-the-counter and prescription medicines only as told by your health care provider.  If you were prescribed an antibiotic medicine, take it as told by your health care provider. Do not stop taking the antibiotic even if you start to feel better. Activity  Gradually return to your normal activities as told by your health care provider. Ask your health care provider what activities are safe for you.  Rest as much as possible. Try to rest or take a nap while your baby is sleeping. Eating and drinking   Drink enough fluid to keep your urine pale yellow.  Eat high-fiber foods every day. These may help prevent or relieve constipation. High-fiber foods include: ? Whole grain cereals and breads. ? Brown rice. ? Beans. ? Fresh fruits and vegetables.  Do not try to lose weight quickly by cutting back on calories.  Take your prenatal vitamins until your postpartum checkup or until your health care provider tells you it is okay to stop. Lifestyle  Do not use any products that contain nicotine or tobacco, such as cigarettes and e-cigarettes. If you need help quitting, ask your health care provider.  Do not drink alcohol, especially if you are breastfeeding. General instructions  Keep all follow-up visits for you and your baby as told by your health care provider. Most women visit their health care provider for a postpartum checkup within the first 3-6 weeks after delivery. Contact a health care provider if:  You feel unable to cope with the changes that your child brings to your life, and these feelings do not go away.  You feel unusually sad or worried.  Your breasts become red, painful, or hard.  You have a fever.  You have trouble holding urine or keeping urine from leaking.  You have little or no interest in activities you used to enjoy.  You have not  breastfed at all and you have not had a menstrual period for 12 weeks after delivery.  You have stopped breastfeeding and you have not had a menstrual period for 12 weeks after you stopped breastfeeding.  You have questions about caring for yourself or your baby.  You pass a blood clot from your vagina. Get help right away if:  You have chest pain.  You have difficulty breathing.  You have sudden, severe leg pain.  You have severe pain or cramping in your lower abdomen.  You bleed from your vagina so much that you fill more than one sanitary pad in one hour. Bleeding should not be heavier than your heaviest period.  You develop a severe headache.  You faint.  You have blurred vision or spots in your vision.  You have bad-smelling vaginal discharge.  You have thoughts about hurting yourself or your baby. If you ever feel like you may hurt yourself or others, or have thoughts about taking your own life, get help   right away. You can go to the nearest emergency department or call:  Your local emergency services (911 in the [place]  A suicide crisis helpline, such as the National Suicide Prevention Lifeline at 1-800-273-8255. This is open 24 hours a day. Summary  The period of time right after you deliver your newborn up to 6-12 weeks after delivery is called the postpartum period.  Gradually return to your normal activities as told by your health care provider.  Keep all follow-up visits for you and your baby as told by your health care provider. This information is not intended to replace advice given to you by your health care provider. Make sure you discuss any questions you have with your health care provider. Document Revised: 09/17/2017 Document Reviewed: 06/28/2017 Elsevier Patient Education  2020 [employer] Postpartum Hypertension Postpartum hypertension is high blood pressure that remains higher than normal after childbirth. You may not realize that you have  postpartum hypertension if your blood pressure is not being checked regularly. In most cases, postpartum hypertension will go away on its own, usually within a week of delivery. However, for some women, medical treatment is required to prevent serious complications, such as seizures or stroke. What are the causes? This condition may be caused by one or more of the following:  Hypertension that existed before pregnancy (chronic hypertension).  Hypertension that comes on as a result of pregnancy (gestational hypertension).  Hypertensive disorders during pregnancy (preeclampsia) or seizures in women who have high blood pressure during pregnancy (eclampsia).  A condition in which the liver, platelets, and red blood cells are damaged during pregnancy (HELLP syndrome).  A condition in which the thyroid produces too much hormones (hyperthyroidism).  Other rare problems of the nerves (neurological disorders) or blood disorders. In some cases, the cause may not be known. What increases the risk? The following factors may make you more likely to develop this condition:  Chronic hypertension. In some cases, this may not have been diagnosed before pregnancy.  Obesity.  Type 2 diabetes.  Kidney disease.  History of preeclampsia or eclampsia.  Other medical conditions that change the level of hormones in the body (hormonal imbalance). What are the signs or symptoms? As with all types of hypertension, postpartum hypertension may not have any symptoms. Depending on how high your blood pressure is, you may experience:  Headaches. These may be mild, moderate, or severe. They may also be steady, constant, or sudden in onset (thunderclap headache).  Changes in your ability to see (visual changes).  Dizziness.  Shortness of breath.  Swelling of your hands, feet, lower legs, or face. In some cases, you may have swelling in more than one of these locations.  Heart palpitations or a racing  heartbeat.  Difficulty breathing while lying down.  Decrease in the amount of urine that you pass. Other rare signs and symptoms may include:  Sweating more than usual. This lasts longer than a few days after delivery.  Chest pain.  Sudden dizziness when you get up from sitting or lying down.  Seizures.  Nausea or vomiting.  Abdominal pain. How is this diagnosed? This condition may be diagnosed based on the results of a physical exam, blood pressure measurements, and blood and urine tests. You may also have other tests, such as a CT scan or an MRI, to check for other problems of postpartum hypertension. How is this treated? If blood pressure is high enough to require treatment, your options may include:  Medicines to reduce blood pressure (  antihypertensives). Tell your health care provider if you are breastfeeding or if you plan to breastfeed. There are many antihypertensive medicines that are safe to take while breastfeeding.  Stopping medicines that may be causing hypertension.  Treating medical conditions that are causing hypertension.  Treating the complications of hypertension, such as seizures, stroke, or kidney problems. Your health care provider will also continue to monitor your blood pressure closely until it is within a safe range for you. Follow these instructions at home:  Take over-the-counter and prescription medicines only as told by your health care provider.  Return to your normal activities as told by your health care provider. Ask your health care provider what activities are safe for you.  Do not use any products that contain nicotine or tobacco, such as cigarettes and e-cigarettes. If you need help quitting, ask your health care provider.  Keep all follow-up visits as told by your health care provider. This is important. Contact a health care provider if:  Your symptoms get worse.  You have new symptoms, such as: ? A headache that does not get  better. ? Dizziness. ? Visual changes. Get help right away if:  You suddenly develop swelling in your hands, ankles, or face.  You have sudden, rapid weight gain.  You develop difficulty breathing, chest pain, racing heartbeat, or heart palpitations.  You develop severe pain in your abdomen.  You have any symptoms of a stroke. "BE FAST" is an easy way to remember the main warning signs of a stroke: ? B - Balance. Signs are dizziness, sudden trouble walking, or loss of balance. ? E - Eyes. Signs are trouble seeing or a sudden change in vision. ? F - Face. Signs are sudden weakness or numbness of the face, or the face or eyelid drooping on one side. ? A - Arms. Signs are weakness or numbness in an arm. This happens suddenly and usually on one side of the body. ? S - Speech. Signs are sudden trouble speaking, slurred speech, or trouble understanding what people say. ? T - Time. Time to call emergency services. Write down what time symptoms started.  You have other signs of a stroke, such as: ? A sudden, severe headache with no known cause. ? Nausea or vomiting. ? Seizure. These symptoms may represent a serious problem that is an emergency. Do not wait to see if the symptoms will go away. Get medical help right away. Call your local emergency services (911 in the [place] Do not drive yourself to the hospital. Summary  Postpartum hypertension is high blood pressure that remains higher than normal after childbirth.  In most cases, postpartum hypertension will go away on its own, usually within a week of delivery.  For some women, medical treatment is required to prevent serious complications, such as seizures or stroke. This information is not intended to replace advice given to you by your health care provider. Make sure you discuss any questions you have with your health care provider. Document Revised: 10/21/2018 Document Reviewed: 07/05/2017 Elsevier Patient Education  2020 Elsevier  Inc.  

## 2019-11-12 NOTE — Lactation Note (Signed)
This note was copied from a baby's chart. Lactation Consultation Note  Patient Name: [name] [name] [id number] Date: 11/12/2019 Reason for consult: Follow-up assessment;Hyperbilirubinemia;Late-preterm 34-36.6wks  LC in to visit with P2 Mom of [redacted]w[redacted]d baby at 72 hrs old.  Baby at 7% weight loss, output 8 voids and 4 stools last 24 hrs.   Baby remains on phototherapy due to bilirubin still increasing.   Mom still does not wish to pump.  Mom reports baby breastfeeding very well, hearing swallows, feeling uterine cramping, and denies nipple pain.   Baby lying in Mom's lap on bili blanket.   Mom feels comfortable hand expressing.  Recommended she offer baby colostrum by spoon after breastfeeding, or before.   Reminded Mom that she can ask for help prn.  Interventions Interventions: Breast feeding basics reviewed;Skin to skin;Breast massage;Hand express;Expressed milk   Consult Status Consult Status: Follow-up Date: 11/13/19 Follow-up type: In-patient    Judee Clara 11/12/2019, 3:45 PM

## 2019-11-12 NOTE — Progress Notes (Signed)
Pt discharged with printed instructions. PT verbalized an understanding. No concerns noted. Carmelina Dane RN

## 2019-11-13 ENCOUNTER — Ambulatory Visit: Payer: Self-pay

## 2019-11-13 LAB — T.PALLIDUM AB, TOTAL: T Pallidum Abs: NONREACTIVE

## 2019-11-13 LAB — GC/CHLAMYDIA PROBE AMP (~~LOC~~) NOT AT ARMC
Chlamydia: NEGATIVE
Comment: NEGATIVE
Comment: NORMAL
Neisseria Gonorrhea: NEGATIVE

## 2019-11-13 NOTE — Lactation Note (Signed)
This note was copied from a baby's chart. Lactation Consultation Note  Patient Name: [name] [name] [id number] Date: 11/13/2019 Reason for consult: Follow-up assessment Baby is 70 hours old.8% weight loss.  Baby only lost additional 1% since yesterday.  Phototherapy was discontinued this morning.  Mom reports that baby is feeding very well.  Breasts are feeling heavier.  Instructed on prevention and treatment of engorgement.  She has a breast pump at home.  Mom denies questions or concerns. Reviewed outpatient services and encouraged to call prn.  Maternal Data    Feeding Feeding Type: Breast Fed  LATCH Score                   Interventions    Lactation Tools Discussed/Used     Consult Status Consult Status: Complete Follow-up type: Call as needed    Huston Foley 11/13/2019, 10:53 AM

## 2019-11-14 ENCOUNTER — Ambulatory Visit (HOSPITAL_COMMUNITY): Payer: Medicaid Other

## 2019-11-16 ENCOUNTER — Telehealth: Payer: Medicaid Other | Admitting: Family Medicine

## 2019-11-16 ENCOUNTER — Telehealth: Payer: Medicaid Other

## 2019-11-17 ENCOUNTER — Other Ambulatory Visit: Payer: Self-pay

## 2019-11-17 ENCOUNTER — Telehealth (INDEPENDENT_AMBULATORY_CARE_PROVIDER_SITE_OTHER): Payer: Medicaid Other

## 2019-11-17 DIAGNOSIS — Z0130 Encounter for examination of blood pressure without abnormal findings: Secondary | ICD-10-CM

## 2019-11-17 NOTE — Progress Notes (Signed)
Called pt at 1000; VM left stating I will call back at appt time for blood pressure check or pt may call the office if she is available before.  Called pt at [phone] for virtual BP check appt. Pt is 1 week s/p vaginal delivery; IOL for severe preeclampsia. Pt has history of chronic htn. BP meds discontinued prior to discharge. Pt states she is not currently at home and cannot take BP. States her BP yesterday was 124/75. Denies any s/s of htn. Reviewed hx and most recent BP with Debroah Loop, MD who states pt may continue current plan of care and f/u at appt on 11/22/19. Explained to pt that she should call the office with any changes and go to [facility] if she experiences any headache, dizziness, blurry vision, or new swelling and her BP is 140/90 or higher. Pt verbalizes understanding.   Isabel RN 11/17/19

## 2019-11-17 NOTE — Progress Notes (Signed)
Patient ID: [name] [name], female   DOB: [birth year], 29 y.o.   MRN: [id number] Patient seen and assessed by nursing staff during this encounter. I have reviewed the chart and agree with the documentation and plan.  Scheryl Darter, MD 11/17/2019 12:28 PM

## 2019-11-22 ENCOUNTER — Telehealth: Payer: Self-pay | Admitting: Obstetrics & Gynecology

## 2019-11-22 ENCOUNTER — Ambulatory Visit: Payer: Medicaid Other | Admitting: Obstetrics & Gynecology

## 2019-11-22 NOTE — Telephone Encounter (Signed)
The patient called in stating she would like to cancel her visit because she is out of town and she will call back to reschedule at a later time.

## 2019-11-23 ENCOUNTER — Telehealth: Payer: Medicaid Other | Admitting: Family Medicine

## 2019-11-29 ENCOUNTER — Telehealth: Payer: Medicaid Other | Admitting: Obstetrics and Gynecology

## 2019-12-02 ENCOUNTER — Inpatient Hospital Stay (HOSPITAL_COMMUNITY): Admission: RE | Admit: 2019-12-02 | Payer: Medicaid Other | Source: Home / Self Care

## 2019-12-11 ENCOUNTER — Encounter: Payer: Self-pay | Admitting: Medical

## 2019-12-11 ENCOUNTER — Other Ambulatory Visit: Payer: Self-pay

## 2019-12-11 ENCOUNTER — Telehealth (INDEPENDENT_AMBULATORY_CARE_PROVIDER_SITE_OTHER): Payer: Medicaid Other | Admitting: Medical

## 2019-12-11 VITALS — BP 129/80

## 2019-12-11 DIAGNOSIS — Z1389 Encounter for screening for other disorder: Secondary | ICD-10-CM

## 2019-12-11 DIAGNOSIS — I10 Essential (primary) hypertension: Secondary | ICD-10-CM

## 2019-12-11 DIAGNOSIS — O99283 Endocrine, nutritional and metabolic diseases complicating pregnancy, third trimester: Secondary | ICD-10-CM

## 2019-12-11 DIAGNOSIS — E039 Hypothyroidism, unspecified: Secondary | ICD-10-CM

## 2019-12-11 NOTE — Patient Instructions (Signed)

## 2019-12-11 NOTE — Progress Notes (Signed)
I connected with@ on 12/11/19 at  1:55 PM EDT by: Telephone and verified that I am speaking with the correct person using two identifiers.  Patient is located at [facility] and provider is located at [place].     The purpose of this virtual visit is to provide medical care while limiting exposure to the novel coronavirus. I discussed the limitations, risks, security and privacy concerns of performing an evaluation and management service by MyChart and the availability of in person appointments. I also discussed with the patient that there may be a patient responsible charge related to this service. By engaging in this virtual visit, you consent to the provision of healthcare.  Additionally, you authorize for your insurance to be billed for the services provided during this visit.  The patient expressed understanding and agreed to proceed.  The following staff members participated in the virtual visit:  Aundra Dubin, Virgina Evener, CMA  Post Partum Visit Note Subjective:    Ms. [name] [name] is a 29 y.o. [id number] female who presents for a postpartum visit. She is 4 weeks postpartum following a SVD. I have fully reviewed the prenatal and intrapartum course. The delivery was at  36.6 gestational weeks. Outcome: spontaneous vaginal delivery. Anesthesia: epidural. Postpartum course has been unremarkable. Baby's course has been unremarkable. Baby is feeding by breast. Bleeding no bleeding. Bowel function is normal. Bladder function is normal. Patient is not sexually active. Contraception method is condoms. Postpartum depression screening: Negative.  The following portions of the patient's history were reviewed and updated as appropriate: allergies, current medications, past family history, past medical history, past social history, past surgical history and problem list.  Review of Systems Pertinent items are noted in HPI.   Objective:   Vitals:   12/11/19 1401  BP: 129/80   Self-Obtained       Assessment:    Normal postpartum exam. Pap smear not done at today's visit. Last pap smear 06/2018 and results were Normal. Next pap due 06/2021.  History of pre-eclampsia  Hypothroid  Plan:    1. Contraception: condoms 2. Referral to [facility] for PCP care sent 3. Follow up as needed.   10 minutes of non-face-to-face time spent with the patient   Kathlene Cote 12/11/2019 2:07 PM

## 2021-01-03 IMAGING — US US MFM FETAL BPP W/O NON-STRESS
1 series · 15 of 24 positions shown · non-contrast
Comparison: none

[Series 1: us mfm fetal bpp w/o non-stress · 24 acquisitions, 15 frames shown]
[im 1/24]
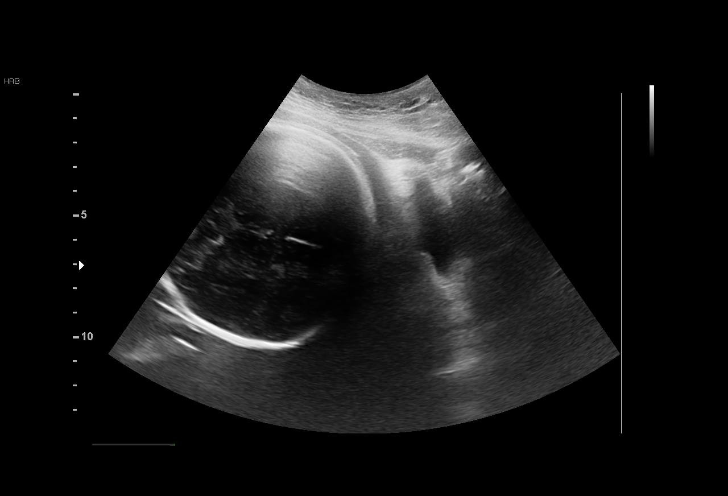
[im 3/24]
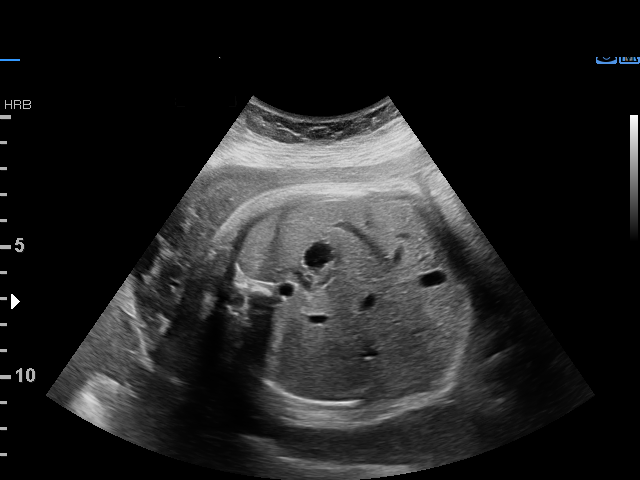
[im 5/24]
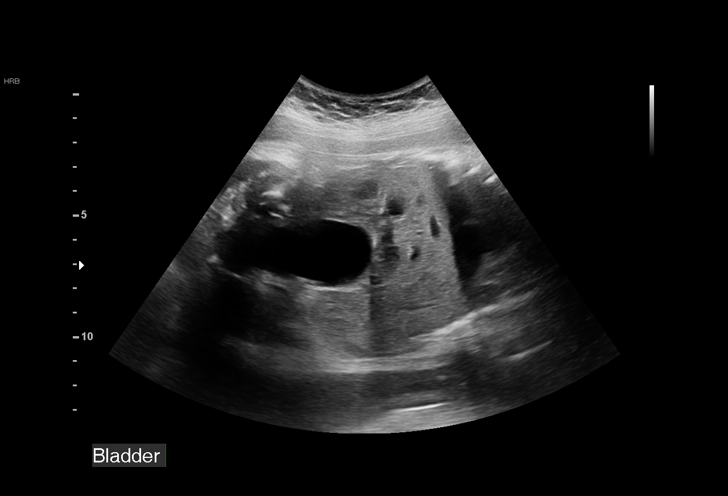
[im 6/24]
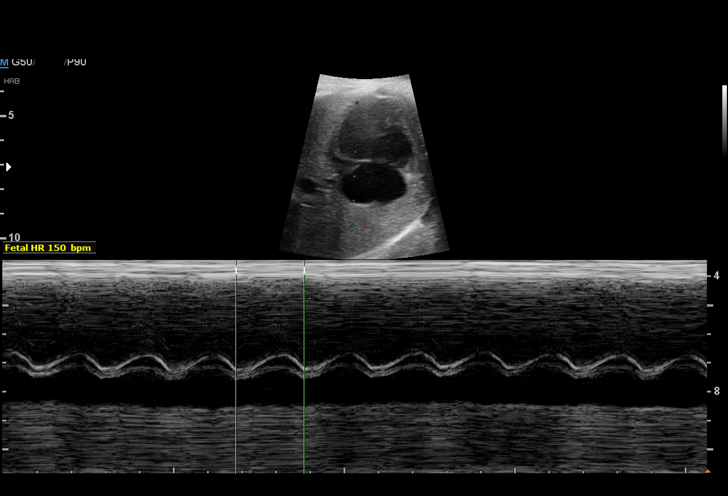
[im 8/24]
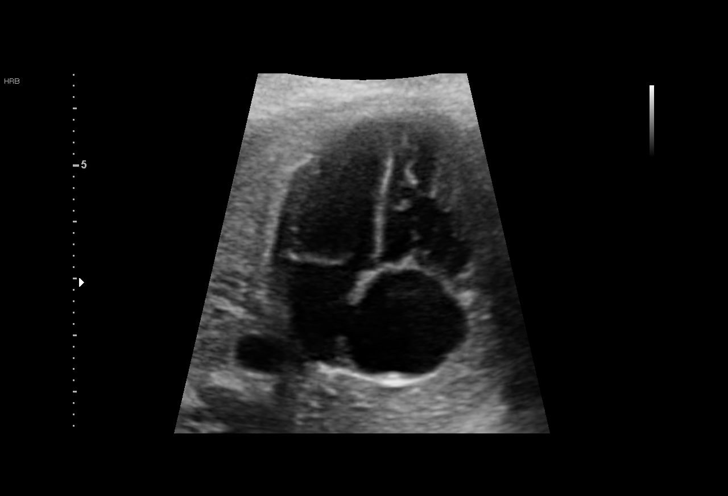
[im 9/24]
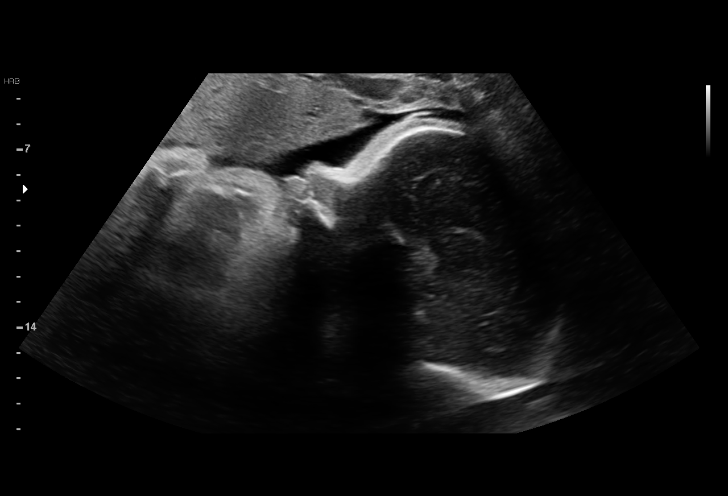
[im 11/24]
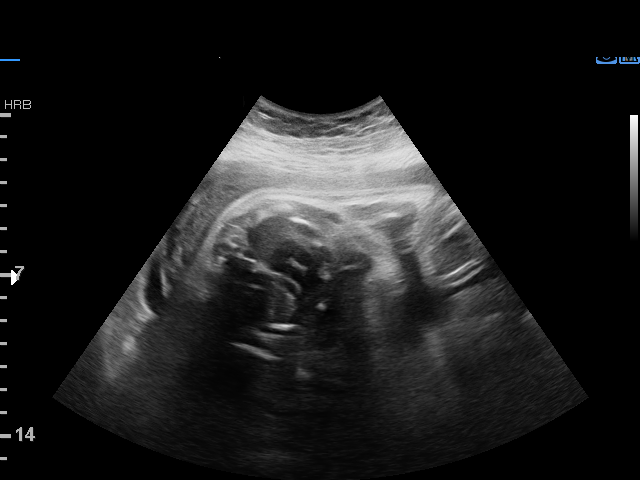
[im 13/24]
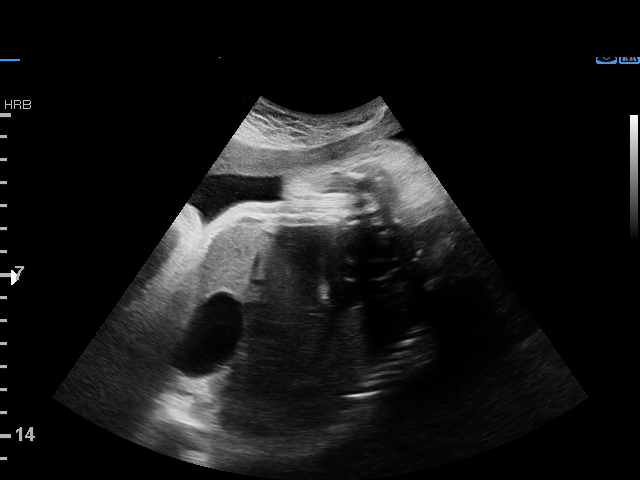
[im 14/24]
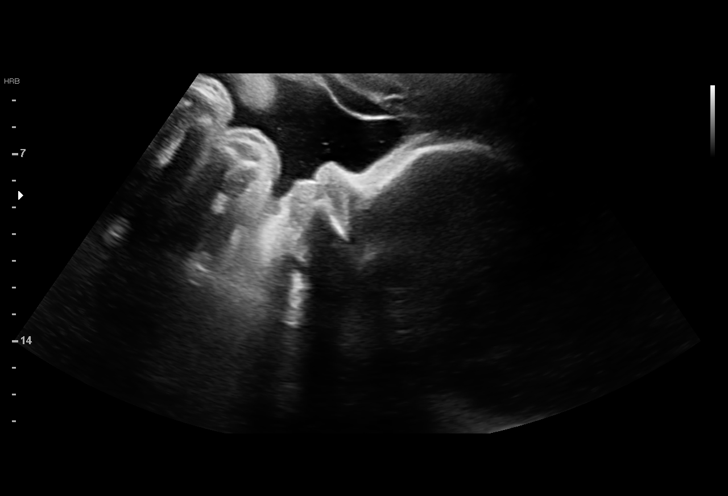
[im 16/24]
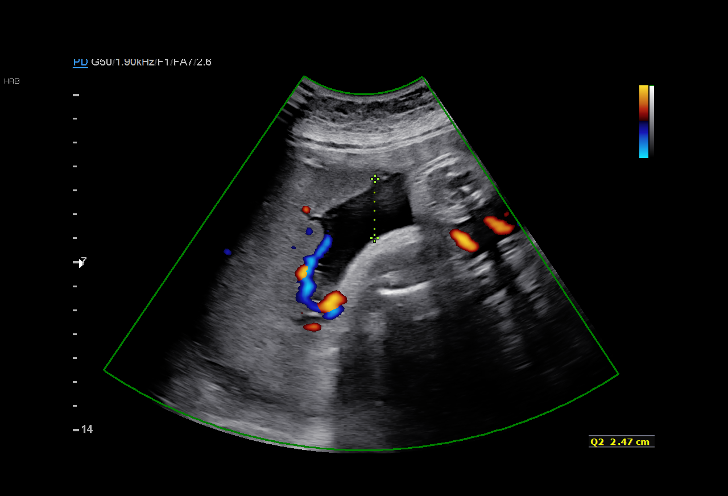
[im 17/24]
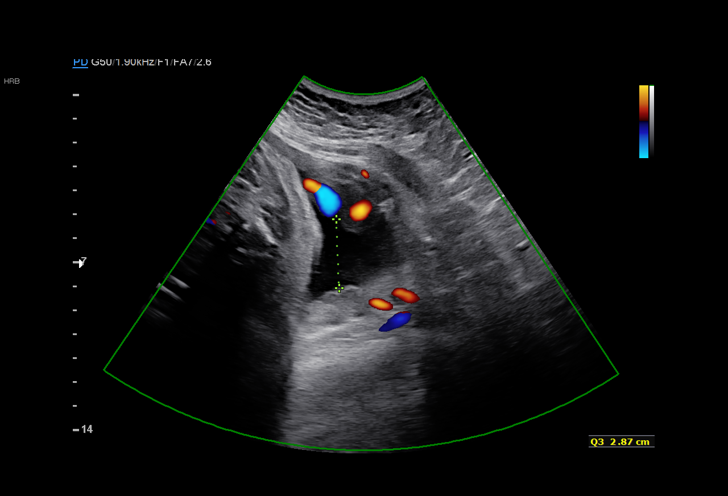
[im 19/24]
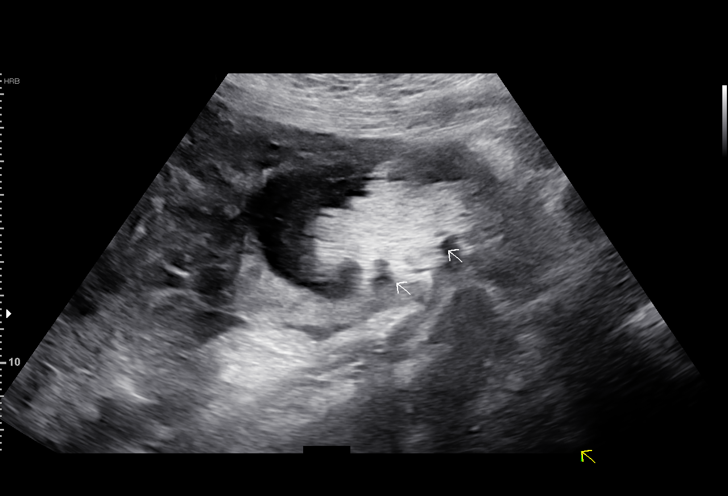
[im 21/24]
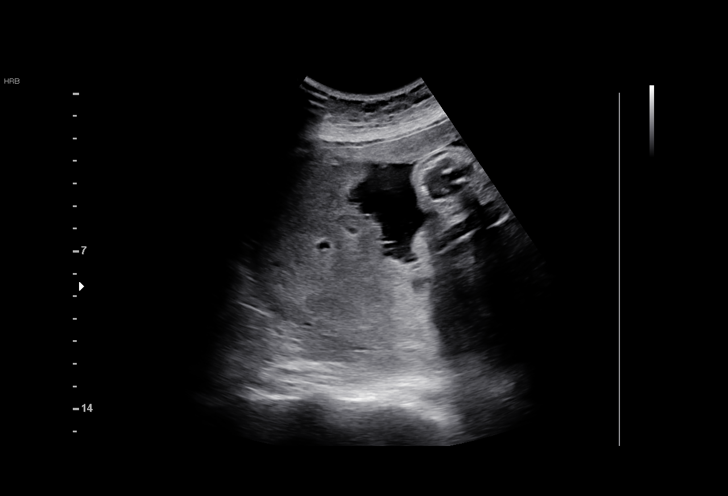
[im 22/24]
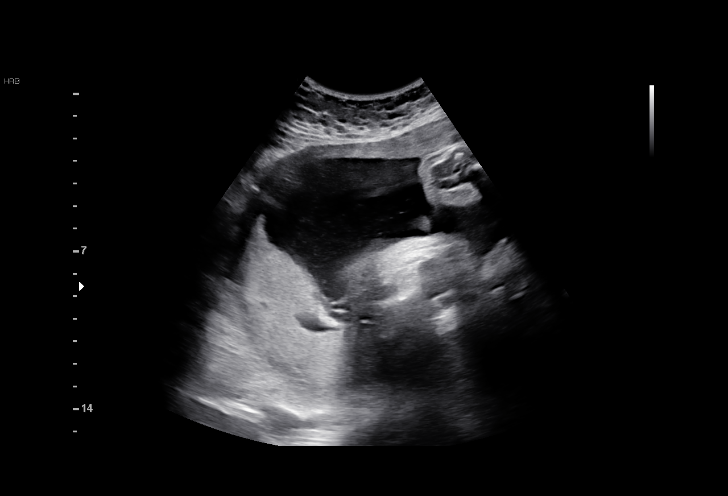
[im 24/24]
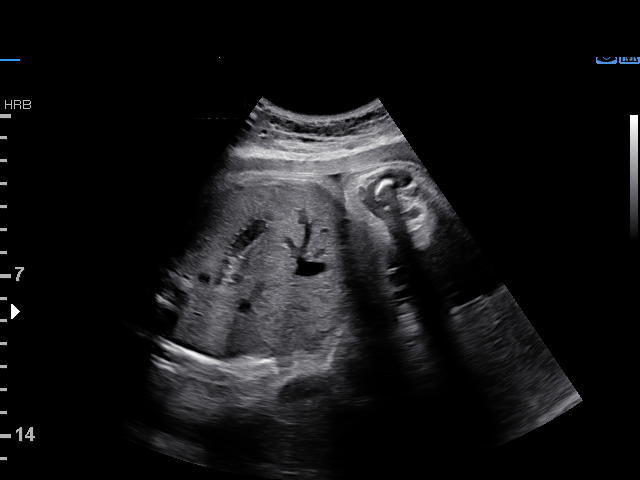

[15 of 24 positions shown; findings below may reference images not displayed]

[address]

 ----------------------------------------------------------------------

 ----------------------------------------------------------------------
Indications

  Hypertension - Chronic/Pre-existing (ASA) -
  poor control
  Obesity complicating pregnancy, second
  trimester (prepreg BMI 32.3)
  Hypothyroid (Synthroid)
  Low Risk NIPS, neg Horizon
  Poor obstetric history: Previous
  preeclampsia / eclampsia/gestational HTN
  (IOL for precclampsa @ 40w, 1st pregnancy)
  34 weeks gestation of pregnancy
 ----------------------------------------------------------------------
Vital Signs

 BMI:
Fetal Evaluation

 Num Of Fetuses:         1
 Fetal Heart Rate(bpm):  150
 Cardiac Activity:       Observed
 Presentation:           Cephalic
 Placenta:               Posterior
 P. Cord Insertion:      Previously Visualized

 Amniotic Fluid
 AFI FV:      Within normal limits

 AFI Sum(cm)     %Tile       Largest Pocket(cm)
 11.32           29

 RUQ(cm)       RLQ(cm)       LUQ(cm)        LLQ(cm)

Biophysical Evaluation

 Amniotic F.V:   Within normal limits       F. Tone:        Observed
 F. Movement:    Observed                   Score:          [DATE]
 F. Breathing:   Observed
OB History

 Gravidity:    3         Term:   1        Prem:   0        SAB:   1
 TOP:          0       Ectopic:  0        Living: 1
Gestational Age

 Best:          34w 2d     Det. By:  Early Ultrasound         EDD:   12/02/19
                                     (04/25/19)
Anatomy

 Thoracic:              Appears normal         Stomach:                Appears normal, left
                                                                       sided
 Heart:                 Appears normal
                        (4CH, axis, and
                        situs)
Cervix Uterus Adnexa

 Cervix
 Not visualized (advanced GA >61wks)
Comments

 This patient was seen for a biophysical profile due to her
 history of chronic hypertension.  Her blood pressures today
 were within normal limits.  She denies having any symptoms
 related to preeclampsia.  She was evaluated in the [place]
 week due to her elevated blood pressures.  She had normal
 PIH labs and was sent home.
 A biophysical profile performed today was [DATE].
 There was normal amniotic fluid noted on today's ultrasound
 exam.
 A follow-up exam was scheduled in 1 week.
 Should the patient's blood pressures be persistently elevated,
 delivery should be considered at around 37 weeks.

## 2021-01-18 IMAGING — US US MFM FETAL BPP W/O NON-STRESS
1 series · 13 of 28 positions shown · non-contrast
Comparison: none

[Series 1: us mfm fetal bpp w/o non-stress · 42 acquisitions, 13 frames shown]
[im 2/42]
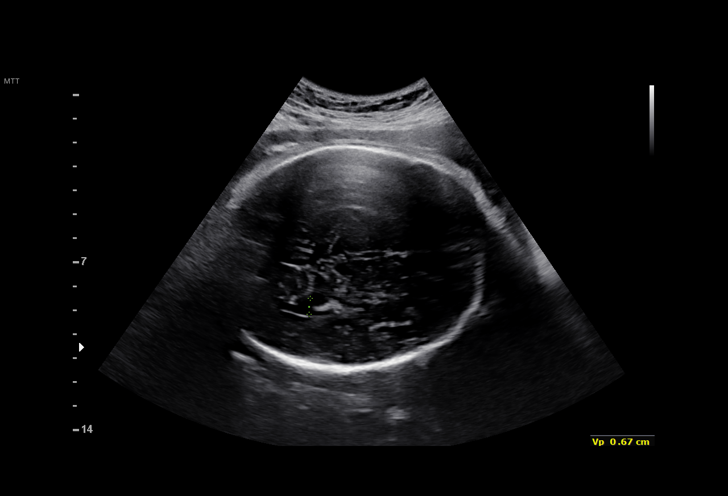
[im 5/42]
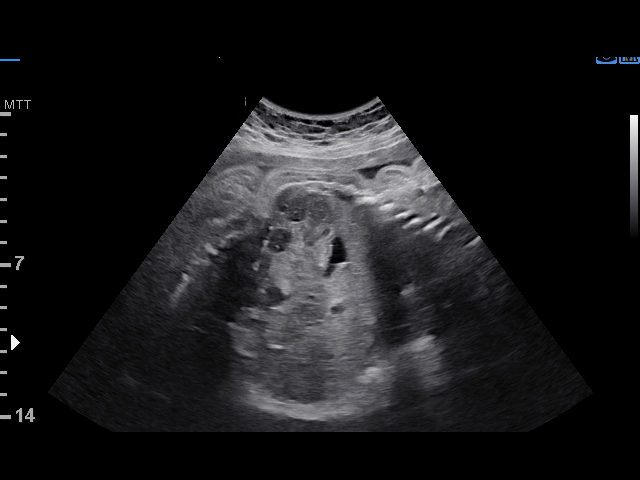
[im 8/42]
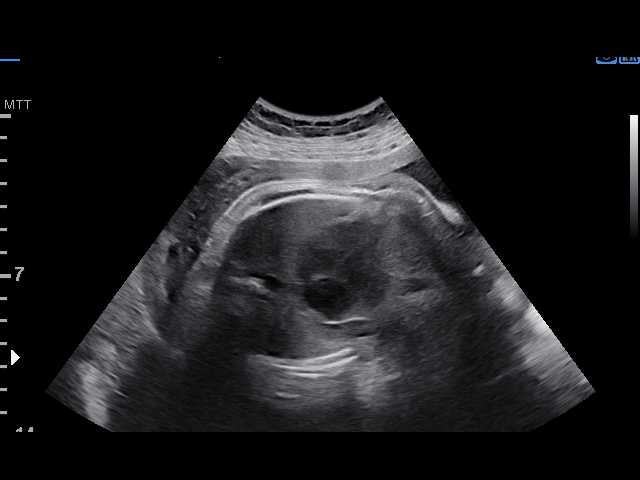
[im 11/42]
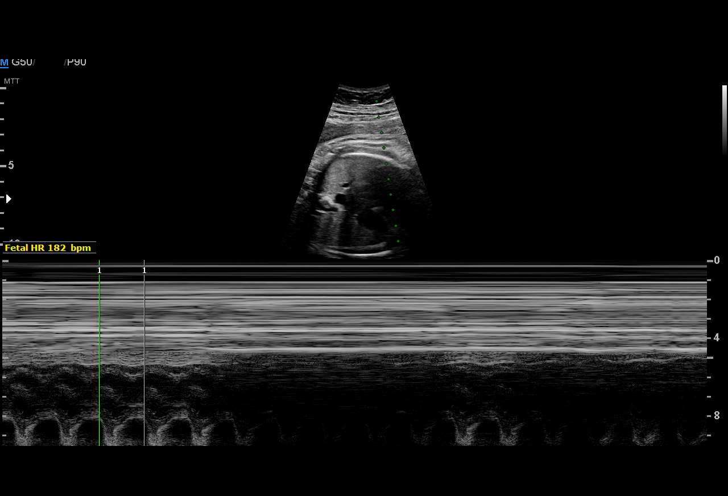
[im 14/42]
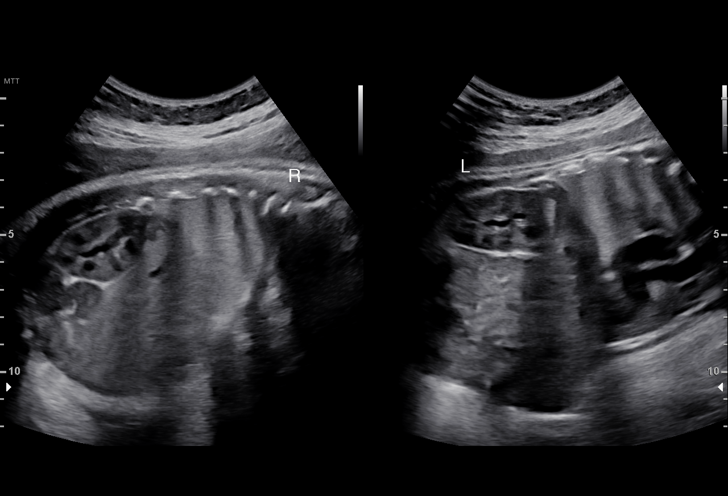
[im 17/42]
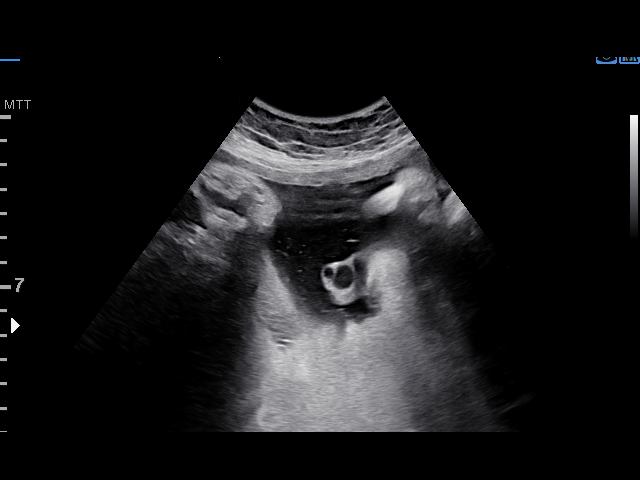
[im 22/42]
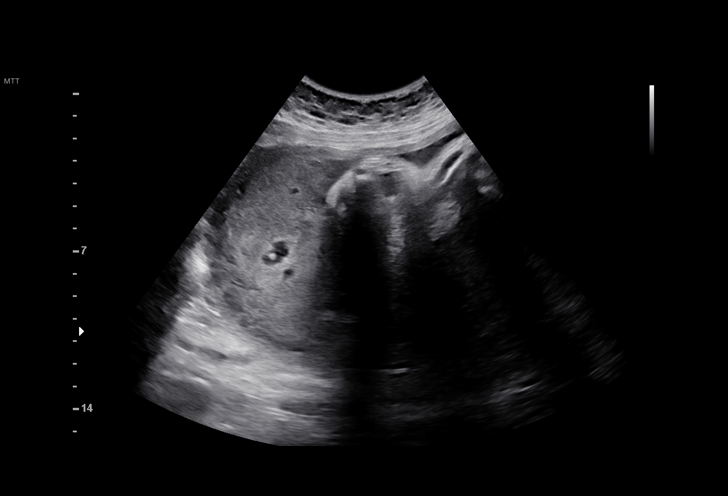
[im 25/42]
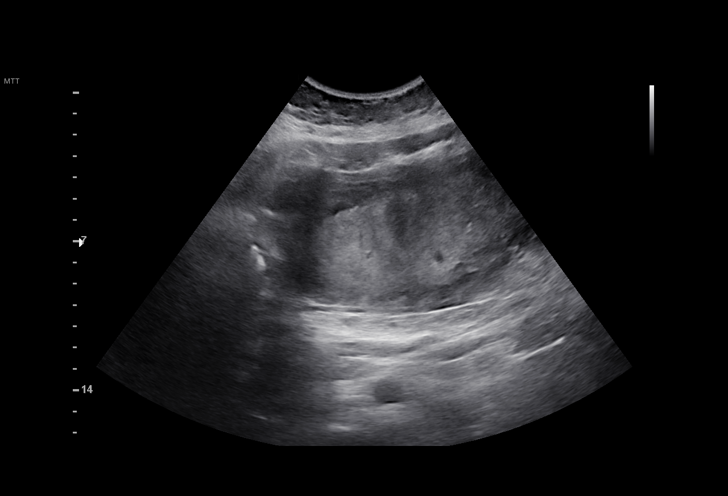
[im 28/42]
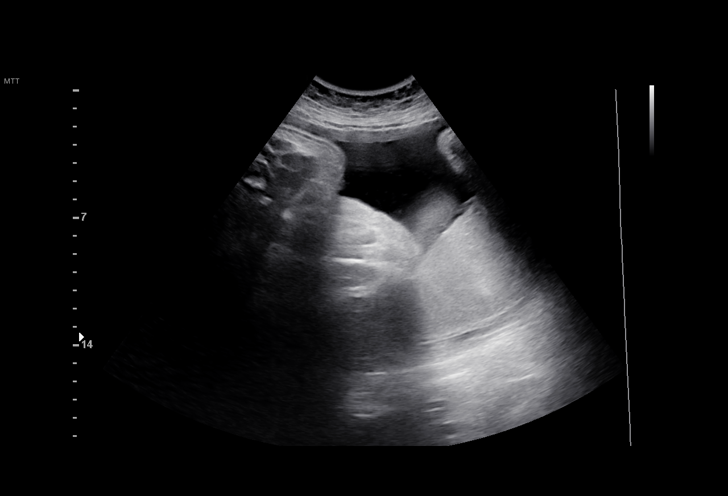
[im 31/42]
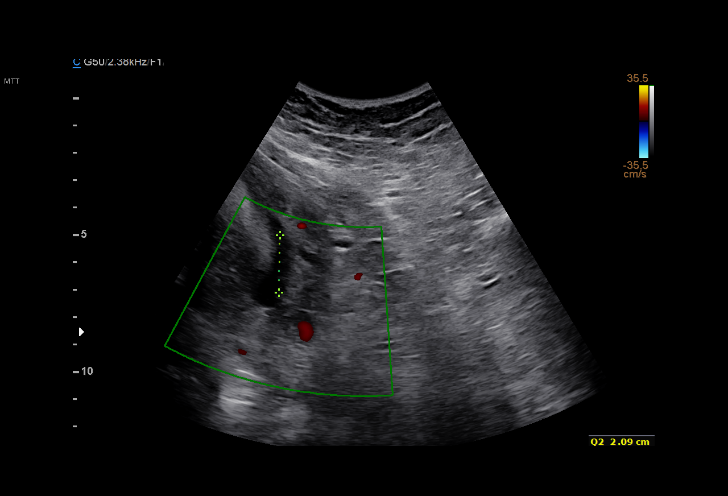
[im 34/42]
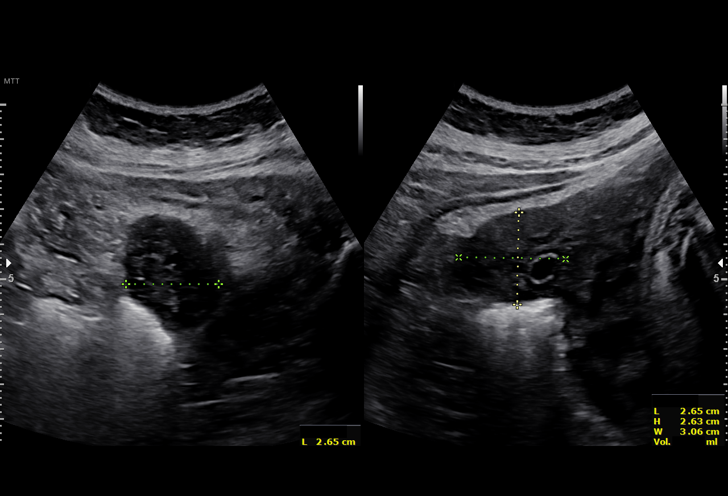
[im 37/42]
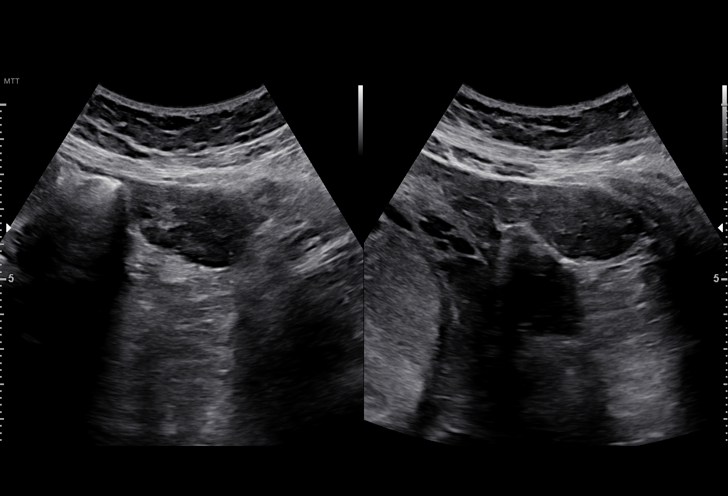
[im 40/42]
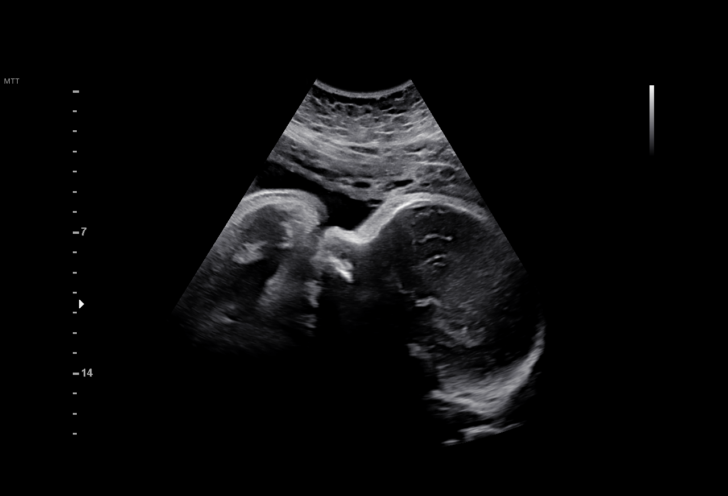

[13 of 28 positions shown; findings below may reference images not displayed]

[address]

 ----------------------------------------------------------------------

 ----------------------------------------------------------------------
Indications

  36 weeks gestation of pregnancy
  Hypertension - Chronic/Pre-existing
  (labetalol)
  Obesity complicating pregnancy, second
  trimester (prepreg BMI 32.3)
  Hypothyroid (Synthroid)
  Low Risk NIPS, neg Horizon
  Poor obstetric history: Previous
  preeclampsia / eclampsia/gestational HTN
  (IOL for precclampsa @ 40w, 1st pregnancy)
 ----------------------------------------------------------------------
Vital Signs

                                                Height:        5'6"
Fetal Evaluation

 Num Of Fetuses:         1
 Fetal Heart Rate(bpm):  182
 Cardiac Activity:       Observed
 Presentation:           Cephalic
 Placenta:               Posterior
 P. Cord Insertion:      Previously Visualized

 Amniotic Fluid
 AFI FV:      Within normal limits

 AFI Sum(cm)     %Tile       Largest Pocket(cm)
 9.55            19

 RUQ(cm)       RLQ(cm)       LUQ(cm)        LLQ(cm)

Biophysical Evaluation

 Amniotic F.V:   Pocket => 2 cm             F. Tone:        Observed
 F. Movement:    Observed                   Score:          [DATE]
 F. Breathing:   Observed
Biometry

 LV:        6.7  mm
OB History

 Gravidity:    3         Term:   1        Prem:   0        SAB:   1
 TOP:          0       Ectopic:  0        Living: 1
Gestational Age

 Best:          36w 3d     Det. By:  Early Ultrasound         EDD:   12/02/19
                                     (04/25/19)
Anatomy

 Cranium:               Previously seen        Aortic Arch:            Previously seen
 Cavum:                 Previously seen        Ductal Arch:            Previously seen
 Ventricles:            Appears normal         Diaphragm:              Appears normal
 Choroid Plexus:        Previously seen        Stomach:                Appears normal, left
                                                                       sided
 Cerebellum:            Previously seen        Abdomen:                Previously seen
 Posterior Fossa:       Previously seen        Abdominal Wall:         Previously seen
 Nuchal Fold:           Previously seen        Cord Vessels:           Previously seen
 Face:                  Orbits and profile     Kidneys:                Appear normal
                        previously seen
 Lips:                  Previously seen        Bladder:                Appears normal
 Thoracic:              Previously seen        Spine:                  Previously seen
 Heart:                 Previously seen        Upper Extremities:      Previously seen
 RVOT:                  Previously seen        Lower Extremities:      Previously seen
 LVOT:                  Previously seen

 Other:  Heels and 5th digit previously visualized. 3VV and 3VTV previously
         visualized. Nasal bone previously visualized.  Technically difficult due
         to advanced GA and fetal position.
Cervix Uterus Adnexa

 Cervix
 Not visualized (advanced GA >26wks)

 Uterus
 No abnormality visualized.

 Left Ovary
 Within normal limits. No adnexal mass visualized.

 Right Ovary
 Within normal limits. No adnexal mass visualized.

 Cul De Sac
 No free fluid seen.
 Adnexa
 No abnormality visualized.
Impression

 Chronic hypertension. Patient takes labetalol 200 mg bid.
 She also has a diagnosis of hypothyroidism and takes
 levothyroxine supplements.
 She reports having mild headaches but no visual
 disturbances or right upper quadrant pain or vaginal bleeding.

 Amniotic fluid is normal and good fetal activity is seen.
 Antenatal testing is reassuring. BPP [DATE].

 BP at our office: 150/109 and repeat 152/80 mm Hg.
 Patient has BP cuff at home and checks regularly.

 We discussed timing of delivery. In pregnant women with
 chronic hypertension, delivery at 38 or 39 weeks is
 associated with good outcomes. It is likely to prevent at least
 20% risk of superimposed preeclampsia. If patient has
 persistent symptoms in addition to hypertension, it is
 reasonable to deliver at 38 weeks' gestation to prevent
 development of superimposed preeclampsia.

 Patient wants to be delivered at 38 weeks. She was
 counseled on the possibility of slightly increased respiratory-
 distress or NICU care in the newborn delivered at 38 weeks
 (as opposed to 39 weeks).
Recommendations

 -BPP next week.
 -Patient has Ob appointment on 11/09/19.
                 Roux, Savita

## 2022-10-09 ENCOUNTER — Telehealth: Payer: Self-pay

## 2022-10-09 NOTE — Telephone Encounter (Signed)
Mychart msg sent. AS, CMA
